# Patient Record
Sex: Female | Born: 1955 | Race: White | Hispanic: No | Marital: Married | Smoking: Former smoker
Health system: Southern US, Community
[De-identification: ages and names within clinical notes are randomized; demographics above are authoritative.]

## PROBLEM LIST (undated history)

## (undated) DIAGNOSIS — Z8619 Personal history of other infectious and parasitic diseases: Secondary | ICD-10-CM

## (undated) DIAGNOSIS — M1990 Unspecified osteoarthritis, unspecified site: Secondary | ICD-10-CM

## (undated) DIAGNOSIS — I341 Nonrheumatic mitral (valve) prolapse: Secondary | ICD-10-CM

## (undated) DIAGNOSIS — Z9889 Other specified postprocedural states: Secondary | ICD-10-CM

## (undated) DIAGNOSIS — I10 Essential (primary) hypertension: Secondary | ICD-10-CM

## (undated) DIAGNOSIS — Z87898 Personal history of other specified conditions: Secondary | ICD-10-CM

## (undated) DIAGNOSIS — R011 Cardiac murmur, unspecified: Secondary | ICD-10-CM

## (undated) HISTORY — DX: Cardiac murmur, unspecified: R01.1

## (undated) HISTORY — DX: Nonrheumatic mitral (valve) prolapse: I34.1

## (undated) HISTORY — DX: Personal history of other infectious and parasitic diseases: Z86.19

## (undated) HISTORY — DX: Personal history of other specified conditions: Z87.898

## (undated) HISTORY — DX: Other specified postprocedural states: Z98.890

## (undated) HISTORY — DX: Unspecified osteoarthritis, unspecified site: M19.90

## (undated) HISTORY — DX: Essential (primary) hypertension: I10

## (undated) HISTORY — PX: DILATION AND CURETTAGE OF UTERUS: SHX78

---

## 2011-12-05 DIAGNOSIS — Z9889 Other specified postprocedural states: Secondary | ICD-10-CM

## 2011-12-05 HISTORY — DX: Other specified postprocedural states: Z98.890

## 2012-08-04 HISTORY — PX: BREAST LUMPECTOMY: SHX2

## 2013-02-03 LAB — HM MAMMOGRAPHY

## 2013-02-21 ENCOUNTER — Ambulatory Visit (INDEPENDENT_AMBULATORY_CARE_PROVIDER_SITE_OTHER): Payer: BC Managed Care – PPO | Admitting: Internal Medicine

## 2013-02-21 ENCOUNTER — Encounter: Payer: Self-pay | Admitting: Internal Medicine

## 2013-02-21 VITALS — BP 118/84 | HR 75 | Temp 98.2°F | Ht 65.5 in | Wt 134.0 lb

## 2013-02-21 DIAGNOSIS — N951 Menopausal and female climacteric states: Secondary | ICD-10-CM

## 2013-02-21 DIAGNOSIS — I10 Essential (primary) hypertension: Secondary | ICD-10-CM | POA: Insufficient documentation

## 2013-02-21 DIAGNOSIS — M1990 Unspecified osteoarthritis, unspecified site: Secondary | ICD-10-CM | POA: Insufficient documentation

## 2013-02-21 DIAGNOSIS — M25532 Pain in left wrist: Secondary | ICD-10-CM

## 2013-02-21 DIAGNOSIS — M25539 Pain in unspecified wrist: Secondary | ICD-10-CM | POA: Insufficient documentation

## 2013-02-21 NOTE — Progress Notes (Signed)
Chief Complaint  Patient presents with  . Establish Care    Pt thinks she has tendonitis in her hands.  Ongoing for a couple of months.  Not getting any better.  Has tried Ibuprofen etc.    HPI: Patient comes in as new patient visit . Previous care was through specialists and her previous primary care in [place]. Is generally well except has problems with osteoarthritis controlled hypertension and hot flashes controlled on low dose Lexapro. Here to establish PCP and problem based .   Concern today  Trouble with hand  Left    Dr Eileen Hall  did not pillow was from arthritis Hold off on cortisone at this time  Right hand had  trigger finger that resolved with taping however is Is left handed and hard to golf and learning how to golg. The last months has had some pain in her left hand near the snuff box without associated injury redness or swelling. She does wear a support brace of some sort has been taking ibuprofen without significant help and sometimes hard for her to lift things but has no associated weakness. Denies any history of falling in the past.   Ht ; on medication couple years     no se last lab monitoring was probably within the last year  MVP ;  No murmur runs in family . No symptoms no needed followup except when necessary  Arthritis;  Seeing dr Eileen Hall.  And also saw rheumatologist in [place] most recently has had problems with her hip;  PT for left hip .  And had cortisone shots for this .   Felt to be from OA . Not an inflammatory arthritis.  Hx of breast bx. lumpectomy 2013 at [facility] surgeon Dr. Fleet Contras Hall  and also [facility].   Routine fu.    6 months and  Routine.    Having hot flushes and  Was put on lexapro    Nov 12  And helped.    5 mg per day .    Last pap Dr. Laural Hall in [place]..  history of abnormal is looking into a gynecologist locally  colonsocopy 2011  10 year recall.   ROS: See pertinent positives and negatives per HPI. Negative chest  pain shortness of breath hearing vision issues no eye disease eye check last year. Nausea vomiting unusual weight loss skin areas of concern injury. LMP March 2013 last mammogram March 2014 Self emplolyed and   More active.    Hydrologist and less driving . This has helped her hip Habits occasional alcohol no current tobacco 3 caffeine vitamins herbal remedies seatbelts regular exercise 7 hours of sleep household of 2 Past Medical History  Diagnosis Date  . Osteoarthritis     rheum  eval and mangement  . Heart murmur   . Hypertension     on meds for a few  years.   . MVP (mitral valve prolapse)     no murmur and no sx   . Hx of varicella   . Hx of lumpectomy 2013    [facility]    . H/O abnormal Pap smear     Family History  Problem Relation Age of Onset  . Cancer Father     Prostate  . Mitral valve prolapse Sister     History   Social History  . Marital Status: Married    Spouse Name: N/A    Number of Children: N/A  . Years of Education: N/A  Social History Main Topics  . Smoking status: Former Games developer  . Smokeless tobacco: None  . Alcohol Use: Yes  . Drug Use: None  . Sexually Active: None   Other Topics Concern  . None   Social History Narrative   hh of 2 married neg ets  No pets    Husband Eileen Hall   caffeine 3 per day  1-2 per week .      Masters / Hydrologist.    International development    history of travel   Ex tobacco    Outpatient Encounter Prescriptions as of 02/21/2013  Medication Sig Dispense Refill  . escitalopram (LEXAPRO) 5 MG tablet       . lisinopril (PRINIVIL,ZESTRIL) 40 MG tablet Take 40 mg by mouth daily.       No facility-administered encounter medications on file as of 02/21/2013.    EXAM:  BP 118/84  Pulse 75  Temp(Src) 98.2 F (36.8 C) (Oral)  Ht 5' 5.5" (1.664 m)  Wt 134 lb (60.782 kg)  BMI 21.95 kg/m2  SpO2 99%  Body mass index is 21.95 kg/(m^2).  GENERAL: vitals reviewed and listed above, alert,  oriented, appears well hydrated and in no acute distress  HEENT: atraumatic, conjunctiva  clear, no obvious abnormalities on inspection of external nose and ears  NECK: no obvious masses on inspection palpation no adenopathy  LUNGS: clear to auscultation bilaterally, no wheezes, rales or rhonchi, good air movement CV: HRRR, no clubbing cyanosis or  peripheral edema nl cap refill  Abdomen soft without organomegaly guarding or rebound MS: moves all extremities without noticeable focal  Abnormality left hand shows normal range of motion some pain with active and passive hyperextension of the wrist and some tenderness at the snuff box. There is no crepitus over the tendons redness or effusion. Grip seems okay neurovascular intact. PSYCH: pleasant and cooperative, no obvious depression or anxiety  ASSESSMENT AND PLAN:  Discussed the following assessment and plan:  Painful wrist, left - Could be overuse tendinitis but tender areas at the snuff box trying to play golf right-handed swing is left-handed dominant  Hypertension  Osteoarthritis  Hot flushes, perimenopausal - controlled by low dose lexapro  -Patient advised to return or notify health care team  if symptoms worsen or persist or new concerns arise.  Patient Instructions  Try aleve 2 twice a day for about 10 days  And continue support and cold therapy and then see Sports Medicine  Dr Eileen Hall ( Eileen ) Hall  At [facility]  To evaluate the left hand .   Get [place]  records of last 2 years visits and  Labs from last 2 years .  More specifically    Chemistry   Panel to check renal function.to include potassium and kidney function   Liver  panel.      Eileen Hall M.D.

## 2013-02-21 NOTE — Patient Instructions (Signed)
Try aleve 2 twice a day for about 10 days  And continue support and cold therapy and then see Sports Medicine  Dr Royal Hawthorn ( bert ) Fields  At [facility]  To evaluate the left hand .   Get [place]  records of last 2 years visits and  Labs from last 2 years .  More specifically    Chemistry   Panel to check renal function.to include potassium and kidney function   Liver  panel.

## 2013-04-02 ENCOUNTER — Encounter: Payer: Self-pay | Admitting: Sports Medicine

## 2013-04-02 ENCOUNTER — Ambulatory Visit (INDEPENDENT_AMBULATORY_CARE_PROVIDER_SITE_OTHER): Payer: BC Managed Care – PPO | Admitting: Sports Medicine

## 2013-04-02 VITALS — BP 116/68 | Ht 65.0 in | Wt 135.0 lb

## 2013-04-02 DIAGNOSIS — M654 Radial styloid tenosynovitis [de Quervain]: Secondary | ICD-10-CM | POA: Insufficient documentation

## 2013-04-02 MED ORDER — MELOXICAM 15 MG PO TABS: 15.0000 mg | ORAL_TABLET | Freq: Every day | ORAL | Status: DC

## 2013-04-02 NOTE — Assessment & Plan Note (Signed)
Clinically and radiologically correlate.  Thumb spica splint to wear at least 12 hours daily either daytime or at night to decrease motion.  Meloxicam daily for 10 days then as needed. Icing protocol discussed, Home exercise program given, will return again in 2-3 weeks for further evaluation and at that time if continues to be painful will consider ultrasound guided steroid injection.

## 2013-04-02 NOTE — Patient Instructions (Signed)
Very nice to meet you Ice is your best friend.  20 minutes 3-4 times a day.  Also do the exercises daily.  Wear the splint at least 12 hours a day  Meloxicam daily for next 10 days then as needed.  Come back again in 2-3 weeks if still painful and we will do injection.

## 2013-04-02 NOTE — Progress Notes (Signed)
Patient was referred here for evaluation of left wrist pain by Dr. Fabian Sharp.   CC: Letf wirst pain  HPI:  Patient is here for evaluation of left wrist pain.  Started insidiously over the coarse of several months with no true injury.  Patient though is an avid golfer and has changed her grip fairly recently.  In addition to this she has been doing much more gardening as well. Patient states that certain movements seem to hurt it more. Patient also states that it can radiate to her thumb.  Patient states the pain can be a chronic dull ache with symptoms of sharp stabbing sensation with certain movements.  Denies numbness, has tried aleve with minimal improvement.  Patient denies nighttime awakenings.  No swelling.   Past Medical History  Diagnosis Date  . Osteoarthritis     rheum  eval and mangement  . Heart murmur   . Hypertension     on meds for a few  years.   . MVP (mitral valve prolapse)     no murmur and no sx   . Hx of varicella   . H[facility] of [facility]pectomy 2013    [facility]    . H/O abnormal Pap smear     Past Surgical History  Procedure Laterality Date  . Breast lumpectomy  08/2012    Family History  Problem Relation Age of Onset  . Cancer Father     Prostate  . Mitral valve prolapse Sister     History   Social History  . Marital Status: Married    Spouse Name: N/A    Number of Children: N/A  . Years of Education: N/A   Social History Main Topics  . Smoking status: Former Games developer  . Smokeless tobacco: Not on file  . Alcohol Use: Yes  . Drug Use: Not on file  . Sexually Active: Not on file   Other Topics Concern  . Not on file   Social History Narrative   hh of 2 married neg ets  No pets    Husband Geni Bers   caffeine 3 per day  1-2 per week .      Masters / Hydrologist.    International development    history of travel   Ex tobacco    Physical exam Blood pressure 116/68, height 5\' 5"  (1.651 m), weight 135 lb (61.236 kg). General: No apparent  distress alert and oriented x3 mood and affect normal Respiratory: Patient's speak in full sentences and does not appear short of breath Skin: Warm dry intact with no signs of infection or rash Neuro: Cranial nerves II through XII are intact, neurovascularly intact in all extremities with 2+ DTRs and 2+ pulses. Left wrist exam:  On inspection patient does have trace effusion over the first dorsal compartment compared to contralateral side. Patient also has a more prominent radial styloid. NT over scaphoid but positive Finklestein test.  Good grip strength, NVI distally, full ROM of wrist.  Muscloskeletal ultrasound performed and interpreted by me today. Patient does have severe hypoechoic changes over the first dorsal compartment surrounding both tendons. No true tear seen. Patient does have normal 2nd -6th compartments with no changes.  Scaphoid bone is normal appearing.

## 2013-04-10 ENCOUNTER — Telehealth: Payer: Self-pay | Admitting: Family Medicine

## 2013-04-10 ENCOUNTER — Other Ambulatory Visit: Payer: Self-pay | Admitting: Family Medicine

## 2013-04-10 MED ORDER — LISINOPRIL 40 MG PO TABS: 40.0000 mg | ORAL_TABLET | Freq: Every day | ORAL | Status: DC

## 2013-04-10 NOTE — Telephone Encounter (Signed)
Sent to the pharmacy by e-scribe. 

## 2013-04-10 NOTE — Telephone Encounter (Signed)
Last seen on 02/21/13 for a new patient visit She is scheduled for CPE on 09/01/13 You have not filled this for her before. Please advise.  Thanks!!

## 2013-04-10 NOTE — Telephone Encounter (Signed)
Ok to do 90  With a refill  ( 6 months  ) we are not managing her HT.

## 2013-05-01 ENCOUNTER — Ambulatory Visit: Payer: BC Managed Care – PPO | Admitting: Sports Medicine

## 2013-05-08 ENCOUNTER — Ambulatory Visit (INDEPENDENT_AMBULATORY_CARE_PROVIDER_SITE_OTHER): Payer: BC Managed Care – PPO | Admitting: Sports Medicine

## 2013-05-08 ENCOUNTER — Encounter: Payer: Self-pay | Admitting: Sports Medicine

## 2013-05-08 VITALS — BP 124/80 | HR 69 | Ht 65.0 in | Wt 135.0 lb

## 2013-05-08 DIAGNOSIS — M1990 Unspecified osteoarthritis, unspecified site: Secondary | ICD-10-CM

## 2013-05-08 DIAGNOSIS — M654 Radial styloid tenosynovitis [de Quervain]: Secondary | ICD-10-CM

## 2013-05-08 NOTE — Patient Instructions (Addendum)
For your Left wrist pain: You have DeQuervain's tendinitis. Please continue to ice the area.  You can use the meloxicam as needed when the pain is severe. Wear the soft splint that we provided. Return for Cortisone shot next week is you feel that you still need this after doing the above and having your hip injection.   For your Right wrist pain: You have overuse injury to the muscle. Please Ice and rest the area.

## 2013-05-08 NOTE — Assessment & Plan Note (Signed)
She was slowly improving  She was given a wrist loop to the use today when typing and using her hand  I will consider injection if she has too much pain before she leaves on her trip

## 2013-05-08 NOTE — Progress Notes (Signed)
Patient ID: Eileen Hall, female   DOB: January 18, 1956, 57 y.o.   MRN: [id number] S: Pt was seen on 4/30 for L wrist pain diagnosed as DeQuervain's tendonitis. She has followed recommendations and been wearing a rigid splint and used meloxicam for 10 days. She notes 50% improvement in symptoms. She is still unable to to Golf without pain and writing and typing are still difficult. She is particularly concerned as she is traveling for work and will be required to write and type extensively. Pt is left handed.  She also notes tenderness in Thenar compartment of R hand. Pt also notes hip pain for which she see Rheumatology and is planning a cortisone injection.   O: Wrist: Inspection with swelling over the L anatomical snuff box with no visible erythema.  ROM smooth and normal with good flexion and extension and ulnar/radial deviation on the right with decreased ROM to 70% Extention, 50% Flexion, 15% radial deviation and 20% ulnar deviation in the left wrist. Also Positive Finkelstein sign with ulnar deviation.   Palpation is normal over metacarpals, navicular, lunate, and TFCC; tendons without tenderness/ swelling Strength 5/5 in all directions without pain. Right thenar compartment with mild tenderness to palpation, Negative Finkelstein.   Hip: Decreased ROM of Left hip with internal and external rotation to   A: Pt with DeQuervain's tendonitis in Left wrist with is improving with conservative treatment. However given upcoming trip and need for increased use of wrist Pt will likely require more aggressive therapy.  Right wrist/hand pain is due to overuse, likely from pain in left wrist.   P: Left Wrist: DeQuervain's: transition to soft splint from rigid to allow for great mobility and functionality especially with increased requirement to write and type given trip.  Continue icing and NSAIDs (pt still have meloxicam) as needed for pain.  Will consider steroid injection into the sheath, however Pt finished a  typhoid vaccine series 6 days ago and was told to wait at least 7 days before receiving steroid injections. Additionally, Pt has appointment with Rheumatology for hip injection. Given both of these will defer tendon sheath injection today. Advised Pt that the systemic effects of the hip injection may be sufficient to bring relief in the wrist as well. If she is still experiencing pain and desires injection next week, to call clinic to arrange for injection.  RTC in 4-6 weeks to follow for resolution in no injection.   Right wrist/hand: Ice and rest  Hip: Per Rheumatolgy

## 2013-05-08 NOTE — Assessment & Plan Note (Signed)
Examination of the left hip showed markedly limited internal rotation at about 15 with external rotation of 35 Rotation of the right hip was 75  Hip flexion is about 10-15 less on the left as well  She has had good relief with hip injection and plans to get this from Dr. Dierdre Forth

## 2013-05-15 ENCOUNTER — Ambulatory Visit (INDEPENDENT_AMBULATORY_CARE_PROVIDER_SITE_OTHER): Payer: BC Managed Care – PPO | Admitting: Sports Medicine

## 2013-05-15 VITALS — BP 120/70 | Ht 65.0 in | Wt 132.0 lb

## 2013-05-15 DIAGNOSIS — M654 Radial styloid tenosynovitis [de Quervain]: Secondary | ICD-10-CM

## 2013-05-15 NOTE — Patient Instructions (Addendum)
Thank you for coming in today. Take it easy for a day or so after the injection.  Return as needed.  Call or go to the ER if you develop a large red swollen joint with extreme pain or oozing puss.  Take your wrist brace with you.  Wear it as needed.

## 2013-05-15 NOTE — Progress Notes (Signed)
Eileen Hall is a 57 y.o. female who presents to [facility] today for left wrist de Quervain's injection.  Patient was recently seen and diagnosed with de Quervain's tenosynovitis. She is here today for her previously arranged de Quervain's injection. No fevers or chills. Continues to have pain on the radial aspect of her wrist with ulnar deviation.   PMH reviewed.  History  Substance Use Topics  . Smoking status: Former Games developer  . Smokeless tobacco: Not on file  . Alcohol Use: Yes   ROS as above otherwise neg   Exam:  BP 120/70  Ht 5\' 5"  (1.651 m)  Wt 132 lb (59.875 kg)  BMI 21.97 kg/m2 Gen: Well NAD Left Wrist Skin is well appearing and intact Tender to palpation over the radial styloid  Limited musculoskeletal ultrasound: Hypoechoic fluid surrounding the first dorsal compartment tendon consistent with tenosynovitis  Procedure: Left de Quervain's injection Performed and consent obtained. The skin was sterilized with alcohol as was the ultrasound probe Sterile Tegaderm was applied over the ultrasound probe Sterile ultrasound gel was used The probe was positioned transverse to the first dorsal wrist compartment tendon sheath at the radial styloid. A 27 one and a half inch needle was used to access the tendon sheath under ultrasound guidance. A small amount of fluid was injected seen to distend the tendon sheath. 10 mg of Kenalog and 1 mm of lidocaine were injected into the tendon sheath under ultrasound guidance. Patient tolerated procedure well

## 2013-05-15 NOTE — Assessment & Plan Note (Signed)
Ultrasound guided injection 05/15/13 F/u PRN Discussed warning signs or symptoms. Please see discharge instructions. Patient expresses understanding.

## 2013-05-16 ENCOUNTER — Telehealth: Payer: Self-pay | Admitting: Internal Medicine

## 2013-05-16 NOTE — Telephone Encounter (Signed)
Pt decline appt

## 2013-05-16 NOTE — Telephone Encounter (Signed)
Pt is requesting a one time diflucan pill call into [facility] on [address]. Pt is going out of town on Monday and has a yeast infection  in progress.

## 2013-05-16 NOTE — Telephone Encounter (Signed)
This patient has only been seen by [facility] once.  She will need to come in and be evaluated for this problem.

## 2013-07-11 ENCOUNTER — Other Ambulatory Visit: Payer: Self-pay | Admitting: Family Medicine

## 2013-08-18 ENCOUNTER — Other Ambulatory Visit: Payer: BC Managed Care – PPO

## 2013-08-19 ENCOUNTER — Other Ambulatory Visit (INDEPENDENT_AMBULATORY_CARE_PROVIDER_SITE_OTHER): Payer: BC Managed Care – PPO

## 2013-08-19 DIAGNOSIS — Z0000 Encounter for general adult medical examination without abnormal findings: Secondary | ICD-10-CM

## 2013-08-19 LAB — LIPID PANEL
Total CHOL/HDL Ratio: 2
Triglycerides: 37 mg/dL (ref 0.0–149.0)

## 2013-08-19 LAB — CBC WITH DIFFERENTIAL/PLATELET
Basophils Relative: 0.4 % (ref 0.0–3.0)
Eosinophils Relative: 0.9 % (ref 0.0–5.0)
Hemoglobin: 13 g/dL (ref 12.0–15.0)
Lymphocytes Relative: 31 % (ref 12.0–46.0)
Monocytes Relative: 7.4 % (ref 3.0–12.0)
Neutro Abs: 3.4 10*3/uL (ref 1.4–7.7)
Neutrophils Relative %: 60.3 % (ref 43.0–77.0)
RBC: 4.57 Mil/uL (ref 3.87–5.11)
WBC: 5.6 10*3/uL (ref 4.5–10.5)

## 2013-08-19 LAB — HEPATIC FUNCTION PANEL
AST: 16 U/L (ref 0–37)
Albumin: 4.3 g/dL (ref 3.5–5.2)
Alkaline Phosphatase: 42 U/L (ref 39–117)
Bilirubin, Direct: 0.1 mg/dL (ref 0.0–0.3)
Total Protein: 6.9 g/dL (ref 6.0–8.3)

## 2013-08-19 LAB — LDL CHOLESTEROL, DIRECT: Direct LDL: 103.8 mg/dL

## 2013-08-19 LAB — BASIC METABOLIC PANEL
CO2: 29 mEq/L (ref 19–32)
Calcium: 9.5 mg/dL (ref 8.4–10.5)
Creatinine, Ser: 0.8 mg/dL (ref 0.4–1.2)
GFR: 80.28 mL/min (ref >60.00)
Sodium: 137 mEq/L (ref 135–145)

## 2013-08-25 ENCOUNTER — Encounter: Payer: BC Managed Care – PPO | Admitting: Internal Medicine

## 2013-09-01 ENCOUNTER — Encounter: Payer: BC Managed Care – PPO | Admitting: Internal Medicine

## 2013-09-01 ENCOUNTER — Ambulatory Visit (INDEPENDENT_AMBULATORY_CARE_PROVIDER_SITE_OTHER): Payer: BC Managed Care – PPO | Admitting: Internal Medicine

## 2013-09-01 ENCOUNTER — Encounter: Payer: Self-pay | Admitting: Internal Medicine

## 2013-09-01 VITALS — BP 126/82 | HR 68 | Temp 97.9°F | Ht 65.25 in | Wt 137.0 lb

## 2013-09-01 DIAGNOSIS — Z9889 Other specified postprocedural states: Secondary | ICD-10-CM

## 2013-09-01 DIAGNOSIS — Z0000 Encounter for general adult medical examination without abnormal findings: Secondary | ICD-10-CM

## 2013-09-01 DIAGNOSIS — N951 Menopausal and female climacteric states: Secondary | ICD-10-CM

## 2013-09-01 DIAGNOSIS — M169 Osteoarthritis of hip, unspecified: Secondary | ICD-10-CM

## 2013-09-01 DIAGNOSIS — Z23 Encounter for immunization: Secondary | ICD-10-CM

## 2013-09-01 DIAGNOSIS — I10 Essential (primary) hypertension: Secondary | ICD-10-CM

## 2013-09-01 DIAGNOSIS — M1612 Unilateral primary osteoarthritis, left hip: Secondary | ICD-10-CM | POA: Insufficient documentation

## 2013-09-01 NOTE — Progress Notes (Signed)
Chief Complaint  Patient presents with  . Annual Exam    HPI: Patient comes in today for Preventive Health Care visit  Update hx ;  Joints  Saw dr Dierdre Forth and alusio   Left hip replacement  Advised      Dr Despina Hick       Has to limit   Walking and pain.   Adapts   Gets ocass steroid injection but not a candidate for many more.   bp controlled  On  meds .     mammograqm  To be done   At [facility]    Oral surgery.   Implant    .   Tomorrow.lrexapro for hot flushes   upt to 10 mf fpor now   ROS:  GEN/ HEENT: No fever, significant weight changes sweats headaches vision problems hearing changes, CV/ PULM; No chest pain shortness of breath cough, syncope,edema  change in exercise tolerance. GI /GU: No adominal pain, vomiting, change in bowel habits. No blood in the stool. No significant GU symptoms. SKIN/HEME: ,no acute skin rashes suspicious lesions or bleeding. No lymphadenopathy, nodules, masses.  NEURO/ PSYCH:  No neurologic signs such as weakness numbness. No depression anxiety. IMM/ Allergy: No unusual infections.  Allergy .   REST of 12 system review negative except as per HPI   Past Medical History  Diagnosis Date  . Osteoarthritis     rheum  eval and mangement  . Heart murmur   . Hypertension     on meds for a few  years.   . MVP (mitral valve prolapse)     no murmur and no sx   . Hx of varicella   . Hx of lumpectomy 2013    bre[facility]   . H/O abnormal Pap smear     Family History  Problem Relation Age of Onset  . Cancer Father     Prostate  . Mitral valve prolapse Sister     History   Social History  . Marital Status: Married    Spouse Name: N/A    Number of Children: N/A  . Years of Education: N/A   Social History Main Topics  . Smoking status: Former Games developer  . Smokeless tobacco: None  . Alcohol Use: Yes  . Drug Use: None  . Sexual Activity: None   Other Topics Concern  . None   Social History Narrative   hh of 2 married neg ets  No  pets    Husband Geni Bers   caffeine 3 per day  1-2 per week .      [employer] / Hydrologist.    International development    history of travel   Ex tobacco    Outpatient Encounter Prescriptions as of 09/01/2013  Medication Sig Dispense Refill  . escitalopram (LEXAPRO) 5 MG tablet       . lisinopril (PRINIVIL,ZESTRIL) 40 MG tablet Take 1 tablet (40 mg total) by mouth daily.  90 tablet  1  . meloxicam (MOBIC) 15 MG tablet Take 1 tablet (15 mg total) by mouth daily.  30 tablet  2  . valACYclovir (VALTREX) 1000 MG tablet        No facility-administered encounter medications on file as of 09/01/2013.    EXAM:  BP 126/82  Pulse 68  Temp(Src) 97.9 F (36.6 C) (Oral)  Ht 5' 5.25" (1.657 m)  Wt 137 lb (62.143 kg)  BMI 22.63 kg/m2  SpO2 98%  Body mass index is 22.63  kg/(m^2).  Physical Exam: Vital signs reviewed YQM:VHQI is a well-developed well-nourished alert cooperative   female who appears her stated age in no acute distress.  HEENT: normocephalic atraumatic , Eyes: PERRL EOM's full, conjunctiva clear, Nares: paten,t no deformity discharge or tenderness., Ears: no deformity EAC's clear TMs with normal landmarks. Mouth: clear OP, no lesions, edema.  Moist mucous membranes. Dentition in adequate repair. NECK: supple without masses, thyromegaly or bruits. CHEST/PULM:  Clear to auscultation and percussion breath sounds equal no wheeze , rales or rhonchi. No chest wall deformities or tenderness. CV: PMI is nondisplaced, S1 S2 no gallops,  No clicks  ? i ffaint syst m comes nad goes  No radiation, rubs. Peripheral pulses are full without delay.No JVD .  Breast: normal by inspection . No dimpling, discharge, masses, tenderness or discharge . ABDOMEN: Bowel sounds normal nontender  No guard or rebound, no hepato splenomegal no CVA tenderness.  No hernia. Extremtities:  No clubbing cyanosis or edema, no acute joint swelling or redness no focal atrophy NEURO:  Oriented x3, cranial  nerves 3-12 appear to be intact, no obvious focal weakness,gait within normal limits no abnormal reflexes or asymmetrical SKIN: No acute rashes normal turgor, color, no bruising or petechiae. 2 small 1-2 mm dark flat moles on back.  PSYCH: Oriented, good eye contact, no obvious depression anxiety, cognition and judgment appear normal. LN: no cervical axillary inguinal adenopathy  Lab Results  Component Value Date   WBC 5.6 08/19/2013   HGB 13.0 08/19/2013   HCT 38.7 08/19/2013   PLT 251.0 08/19/2013   GLUCOSE 89 08/19/2013   CHOL 236* 08/19/2013   TRIG 37.0 08/19/2013   HDL 116.70 08/19/2013   LDLDIRECT 103.8 08/19/2013   ALT 15 08/19/2013   AST 16 08/19/2013   NA 137 08/19/2013   K 4.3 08/19/2013   CL 102 08/19/2013   CREATININE 0.8 08/19/2013   BUN 15 08/19/2013   CO2 29 08/19/2013   TSH 0.93 08/19/2013    ASSESSMENT AND PLAN:  Discussed the following assessment and plan:  Encounter for preventive health examination  Osteoarthritis of left hip - alusio  expects need for total hip r in the next 1=2 years.   Need for prophylactic vaccination and inoculation against influenza - Plan: Flu Vaccine QUAD 36+ mos PF IM (Fluarix)  Hypertension - controlled   Hot flushes, perimenopausal - on lexapro   Hx of lumpectomy Counseled regarding healthy nutrition, exercise, sleep, injury prevention, calcium vit d and healthy weight . Disc zostavax when age 11  Patient Care Team: Madelin Headings, MD as PCP - General (Internal Medicine) Ernestene Kiel as Physician Assistant (Internal Medicine) Donnetta Hail, MD (Rheumatology) Oliver Pila, MD as Consulting Physician (Obstetrics and Gynecology) Patient Instructions  Continue lifestyle intervention healthy eating and exercise . Labs are good.  cpx and labs in a year or as needed.   Preventive Care for Adults, Female A healthy lifestyle and preventive care can promote health and wellness. Preventive health guidelines for women include the  following key practices.  A routine yearly physical is a good way to check with your caregiver about your health and preventive screening. It is a chance to share any concerns and updates on your health, and to receive a thorough exam.  Visit your dentist for a routine exam and preventive care every 6 months. Brush your teeth twice a day and floss once a day. Good oral hygiene prevents tooth decay and gum disease.  The frequency of  eye exams is based on your age, health, family medical history, use of contact lenses, and other factors. Follow your caregiver's recommendations for frequency of eye exams.  Eat a healthy diet. Foods like vegetables, fruits, whole grains, low-fat dairy products, and lean protein foods contain the nutrients you need without too many calories. Decrease your intake of foods high in solid fats, added sugars, and salt. Eat the right amount of calories for you.Get information about a proper diet from your caregiver, if necessary.  Regular physical exercise is one of the most important things you can do for your health. Most adults should get at least 150 minutes of moderate-intensity exercise (any activity that increases your heart rate and causes you to sweat) each week. In addition, most adults need muscle-strengthening exercises on 2 or more days a week.  Maintain a healthy weight. The body mass index (BMI) is a screening tool to identify possible weight problems. It provides an estimate of body fat based on height and weight. Your caregiver can help determine your BMI, and can help you achieve or maintain a healthy weight.For adults 20 years and older:  A BMI below 18.5 is considered underweight.  A BMI of 18.5 to 24.9 is normal.  A BMI of 25 to 29.9 is considered overweight.  A BMI of 30 and above is considered obese.  Maintain normal blood lipids and cholesterol levels by exercising and minimizing your intake of saturated fat. Eat a balanced diet with plenty of  fruit and vegetables. Blood tests for lipids and cholesterol should begin at age 81 and be repeated every 5 years. If your lipid or cholesterol levels are high, you are over 50, or you are at high risk for heart disease, you may need your cholesterol levels checked more frequently.Ongoing high lipid and cholesterol levels should be treated with medicines if diet and exercise are not effective.  If you smoke, find out from your caregiver how to quit. If you do not use tobacco, do not start.  If you are pregnant, do not drink alcohol. If you are breastfeeding, be very cautious about drinking alcohol. If you are not pregnant and choose to drink alcohol, do not exceed 1 drink per day. One drink is considered to be 12 ounces (355 mL) of beer, 5 ounces (148 mL) of wine, or 1.5 ounces (44 mL) of liquor.  Avoid use of street drugs. Do not share needles with anyone. Ask for help if you need support or instructions about stopping the use of drugs.  High blood pressure causes heart disease and increases the risk of stroke. Your blood pressure should be checked at least every 1 to 2 years. Ongoing high blood pressure should be treated with medicines if weight loss and exercise are not effective.  If you are 25 to 55 years old, ask your caregiver if you should take aspirin to prevent strokes.  Diabetes screening involves taking a blood sample to check your fasting blood sugar level. This should be done once every 3 years, after age 61, if you are within normal weight and without risk factors for diabetes. Testing should be considered at a younger age or be carried out more frequently if you are overweight and have at least 1 risk factor for diabetes.  Breast cancer screening is essential preventive care for women. You should practice "breast self-awareness." This means understanding the normal appearance and feel of your breasts and may include breast self-examination. Any changes detected, no matter how small,  should  be reported to a caregiver. Women in their 61s and 30s should have a clinical breast exam (CBE) by a caregiver as part of a regular health exam every 1 to 3 years. After age 46, women should have a CBE every year. Starting at age 50, women should consider having a mammography (breast X-ray test) every year. Women who have a family history of breast cancer should talk to their caregiver about genetic screening. Women at a high risk of breast cancer should talk to their caregivers about having magnetic resonance imaging (MRI) and a mammography every year.  The Pap test is a screening test for cervical cancer. A Pap test can show cell changes on the cervix that might become cervical cancer if left untreated. A Pap test is a procedure in which cells are obtained and examined from the lower end of the uterus (cervix).  Women should have a Pap test starting at age 13.  Between ages 33 and 22, Pap tests should be repeated every 2 years.  Beginning at age 51, you should have a Pap test every 3 years as long as the past 3 Pap tests have been normal.  Some women have medical problems that increase the chance of getting cervical cancer. Talk to your caregiver about these problems. It is especially important to talk to your caregiver if a new problem develops soon after your last Pap test. In these cases, your caregiver may recommend more frequent screening and Pap tests.  The above recommendations are the same for women who have or have not gotten the vaccine for human papillomavirus (HPV).  If you had a hysterectomy for a problem that was not cancer or a condition that could lead to cancer, then you no longer need Pap tests. Even if you no longer need a Pap test, a regular exam is a good idea to make sure no other problems are starting.  If you are between ages 73 and 79, and you have had normal Pap tests going back 10 years, you no longer need Pap tests. Even if you no longer need a Pap test, a regular  exam is a good idea to make sure no other problems are starting.  If you have had past treatment for cervical cancer or a condition that could lead to cancer, you need Pap tests and screening for cancer for at least 20 years after your treatment.  If Pap tests have been discontinued, risk factors (such as a new sexual partner) need to be reassessed to determine if screening should be resumed.  The HPV test is an additional test that may be used for cervical cancer screening. The HPV test looks for the virus that can cause the cell changes on the cervix. The cells collected during the Pap test can be tested for HPV. The HPV test could be used to screen women aged 58 years and older, and should be used in women of any age who have unclear Pap test results. After the age of 60, women should have HPV testing at the same frequency as a Pap test.  Colorectal cancer can be detected and often prevented. Most routine colorectal cancer screening begins at the age of 21 and continues through age 97. However, your caregiver may recommend screening at an earlier age if you have risk factors for colon cancer. On a yearly basis, your caregiver may provide home test kits to check for hidden blood in the stool. Use of a small camera at the end of a  tube, to directly examine the colon (sigmoidoscopy or colonoscopy), can detect the earliest forms of colorectal cancer. Talk to your caregiver about this at age 29, when routine screening begins. Direct examination of the colon should be repeated every 5 to 10 years through age 31, unless early forms of pre-cancerous polyps or small growths are found.  Hepatitis C blood testing is recommended for all people born from [birth year] through 1965 and any individual with known risks for hepatitis C.  Practice safe sex. Use condoms and avoid high-risk sexual practices to reduce the spread of sexually transmitted infections (STIs). STIs include gonorrhea, chlamydia, syphilis, trichomonas,  herpes, HPV, and human immunodeficiency virus (HIV). Herpes, HIV, and HPV are viral illnesses that have no cure. They can result in disability, cancer, and death. Sexually active women aged 76 and younger should be checked for chlamydia. Older women with new or multiple partners should also be tested for chlamydia. Testing for other STIs is recommended if you are sexually active and at increased risk.  Osteoporosis is a disease in which the bones lose minerals and strength with aging. This can result in serious bone fractures. The risk of osteoporosis can be identified using a bone density scan. Women ages 33 and over and women at risk for fractures or osteoporosis should discuss screening with their caregivers. Ask your caregiver whether you should take a calcium supplement or vitamin D to reduce the rate of osteoporosis.  Menopause can be associated with physical symptoms and risks. Hormone replacement therapy is available to decrease symptoms and risks. You should talk to your caregiver about whether hormone replacement therapy is right for you.  Use sunscreen with sun protection factor (SPF) of 30 or more. Apply sunscreen liberally and repeatedly throughout the day. You should seek shade when your shadow is shorter than you. Protect yourself by wearing long sleeves, pants, a wide-brimmed hat, and sunglasses year round, whenever you are outdoors.  Once a month, do a whole body skin exam, using a mirror to look at the skin on your back. Notify your caregiver of new moles, moles that have irregular borders, moles that are larger than a pencil eraser, or moles that have changed in shape or color.  Stay current with required immunizations.  Influenza. You need a dose every fall (or winter). The composition of the flu vaccine changes each year, so being vaccinated once is not enough.  Pneumococcal polysaccharide. You need 1 to 2 doses if you smoke cigarettes or if you have certain chronic medical  conditions. You need 1 dose at age 51 (or older) if you have never been vaccinated.  Tetanus, diphtheria, pertussis (Tdap, Td). Get 1 dose of Tdap vaccine if you are younger than age 85, are over 74 and have contact with an infant, are a Research scientist (physical sciences), are pregnant, or simply want to be protected from whooping cough. After that, you need a Td booster dose every 10 years. Consult your caregiver if you have not had at least 3 tetanus and diphtheria-containing shots sometime in your life or have a deep or dirty wound.  HPV. You need this vaccine if you are a woman age 68 or younger. The vaccine is given in 3 doses over 6 months.  Measles, mumps, rubella (MMR). You need at least 1 dose of MMR if you were born in 1957 or later. You may also need a second dose.  Meningococcal. If you are age 55 to 43 and a first-year college student living in a residence hall,  or have one of several medical conditions, you need to get vaccinated against meningococcal disease. You may also need additional booster doses.  Zoster (shingles). If you are age 75 or older, you should get this vaccine.  Varicella (chickenpox). If you have never had chickenpox or you were vaccinated but received only 1 dose, talk to your caregiver to find out if you need this vaccine.  Hepatitis A. You need this vaccine if you have a specific risk factor for hepatitis A virus infection or you simply wish to be protected from this disease. The vaccine is usually given as 2 doses, 6 to 18 months apart.  Hepatitis B. You need this vaccine if you have a specific risk factor for hepatitis B virus infection or you simply wish to be protected from this disease. The vaccine is given in 3 doses, usually over 6 months. Preventive Services / Frequency Ages 75 to 24  Blood pressure check.** / Every 1 to 2 years.  Lipid and cholesterol check.** / Every 5 years beginning at age 36.  Clinical breast exam.** / Every 3 years for women in their 66s and  30s.  Pap test.** / Every 2 years from ages 28 through 48. Every 3 years starting at age 53 through age 44 or 46 with a history of 3 consecutive normal Pap tests.  HPV screening.** / Every 3 years from ages 17 through ages 69 to 79 with a history of 3 consecutive normal Pap tests.  Hepatitis C blood test.** / For any individual with known risks for hepatitis C.  Skin self-exam. / Monthly.  Influenza immunization.** / Every year.  Pneumococcal polysaccharide immunization.** / 1 to 2 doses if you smoke cigarettes or if you have certain chronic medical conditions.  Tetanus, diphtheria, pertussis (Tdap, Td) immunization. / A one-time dose of Tdap vaccine. After that, you need a Td booster dose every 10 years.  HPV immunization. / 3 doses over 6 months, if you are 39 and younger.  Measles, mumps, rubella (MMR) immunization. / You need at least 1 dose of MMR if you were born in 1957 or later. You may also need a second dose.  Meningococcal immunization. / 1 dose if you are age 22 to 20 and a first-year college student living in a residence hall, or have one of several medical conditions, you need to get vaccinated against meningococcal disease. You may also need additional booster doses.  Varicella immunization.** / Consult your caregiver.  Hepatitis A immunization.** / Consult your caregiver. 2 doses, 6 to 18 months apart.  Hepatitis B immunization.** / Consult your caregiver. 3 doses usually over 6 months. Ages 24 to 43  Blood pressure check.** / Every 1 to 2 years.  Lipid and cholesterol check.** / Every 5 years beginning at age 34.  Clinical breast exam.** / Every year after age 35.  Mammogram.** / Every year beginning at age 65 and continuing for as long as you are in good health. Consult with your caregiver.  Pap test.** / Every 3 years starting at age 66 through age 82 or 83 with a history of 3 consecutive normal Pap tests.  HPV screening.** / Every 3 years from ages 59  through ages 28 to 63 with a history of 3 consecutive normal Pap tests.  Fecal occult blood test (FOBT) of stool. / Every year beginning at age 70 and continuing until age 61. You may not need to do this test if you get a colonoscopy every 10 years.  Flexible  sigmoidoscopy or colonoscopy.** / Every 5 years for a flexible sigmoidoscopy or every 10 years for a colonoscopy beginning at age 45 and continuing until age 45.  Hepatitis C blood test.** / For all people born from [birth year] through 1963 and any individual with known risks for hepatitis C.  Skin self-exam. / Monthly.  Influenza immunization.** / Every year.  Pneumococcal polysaccharide immunization.** / 1 to 2 doses if you smoke cigarettes or if you have certain chronic medical conditions.  Tetanus, diphtheria, pertussis (Tdap, Td) immunization.** / A one-time dose of Tdap vaccine. After that, you need a Td booster dose every 10 years.  Measles, mumps, rubella (MMR) immunization. / You need at least 1 dose of MMR if you were born in 1957 or later. You may also need a second dose.  Varicella immunization.** / Consult your caregiver.  Meningococcal immunization.** / Consult your caregiver.  Hepatitis A immunization.** / Consult your caregiver. 2 doses, 6 to 18 months apart.  Hepatitis B immunization.** / Consult your caregiver. 3 doses, usually over 6 months. Ages 33 and over  Blood pressure check.** / Every 1 to 2 years.  Lipid and cholesterol check.** / Every 5 years beginning at age 52.  Clinical breast exam.** / Every year after age 76.  Mammogram.** / Every year beginning at age 80 and continuing for as long as you are in good health. Consult with your caregiver.  Pap test.** / Every 3 years starting at age 19 through age 68 or 2 with a 3 consecutive normal Pap tests. Testing can be stopped between 65 and 70 with 3 consecutive normal Pap tests and no abnormal Pap or HPV tests in the past 10 years.  HPV screening.** / Every 3  years from ages 66 through ages 54 or 89 with a history of 3 consecutive normal Pap tests. Testing can be stopped between 65 and 70 with 3 consecutive normal Pap tests and no abnormal Pap or HPV tests in the past 10 years.  Fecal occult blood test (FOBT) of stool. / Every year beginning at age 72 and continuing until age 43. You may not need to do this test if you get a colonoscopy every 10 years.  Flexible sigmoidoscopy or colonoscopy.** / Every 5 years for a flexible sigmoidoscopy or every 10 years for a colonoscopy beginning at age 51 and continuing until age 1.  Hepatitis C blood test.** / For all people born from [birth year] through 1963 and any individual with known risks for hepatitis C.  Osteoporosis screening.** / A one-time screening for women ages 68 and over and women at risk for fractures or osteoporosis.  Skin self-exam. / Monthly.  Influenza immunization.** / Every year.  Pneumococcal polysaccharide immunization.** / 1 dose at age 91 (or older) if you have never been vaccinated.  Tetanus, diphtheria, pertussis (Tdap, Td) immunization. / A one-time dose of Tdap vaccine if you are over 65 and have contact with an infant, are a Research scientist (physical sciences), or simply want to be protected from whooping cough. After that, you need a Td booster dose every 10 years.  Varicella immunization.** / Consult your caregiver.  Meningococcal immunization.** / Consult your caregiver.  Hepatitis A immunization.** / Consult your caregiver. 2 doses, 6 to 18 months apart.  Hepatitis B immunization.** / Check with your caregiver. 3 doses, usually over 6 months. ** Family history and personal history of risk and conditions may change your caregiver's recommendations. Document Released: 01/16/2002 Document Revised: 02/12/2012 Document Reviewed: 04/17/2011 [employer] Patient Information 2014  [employer]     Neta Mends. Orrie Lascano M.D.

## 2013-09-01 NOTE — Patient Instructions (Signed)
Continue lifestyle intervention healthy eating and exercise . Labs are good.  cpx and labs in a year or as needed.   Preventive Care for Adults, Female A healthy lifestyle and preventive care can promote health and wellness. Preventive health guidelines for women include the following key practices.  A routine yearly physical is a good way to check with your caregiver about your health and preventive screening. It is a chance to share any concerns and updates on your health, and to receive a thorough exam.  Visit your dentist for a routine exam and preventive care every 6 months. Brush your teeth twice a day and floss once a day. Good oral hygiene prevents tooth decay and gum disease.  The frequency of eye exams is based on your age, health, family medical history, use of contact lenses, and other factors. Follow your caregiver's recommendations for frequency of eye exams.  Eat a healthy diet. Foods like vegetables, fruits, whole grains, low-fat dairy products, and lean protein foods contain the nutrients you need without too many calories. Decrease your intake of foods high in solid fats, added sugars, and salt. Eat the right amount of calories for you.Get information about a proper diet from your caregiver, if necessary.  Regular physical exercise is one of the most important things you can do for your health. Most adults should get at least 150 minutes of moderate-intensity exercise (any activity that increases your heart rate and causes you to sweat) each week. In addition, most adults need muscle-strengthening exercises on 2 or more days a week.  Maintain a healthy weight. The body mass index (BMI) is a screening tool to identify possible weight problems. It provides an estimate of body fat based on height and weight. Your caregiver can help determine your BMI, and can help you achieve or maintain a healthy weight.For adults 20 years and older:  A BMI below 18.5 is considered  underweight.  A BMI of 18.5 to 24.9 is normal.  A BMI of 25 to 29.9 is considered overweight.  A BMI of 30 and above is considered obese.  Maintain normal blood lipids and cholesterol levels by exercising and minimizing your intake of saturated fat. Eat a balanced diet with plenty of fruit and vegetables. Blood tests for lipids and cholesterol should begin at age 24 and be repeated every 5 years. If your lipid or cholesterol levels are high, you are over 50, or you are at high risk for heart disease, you may need your cholesterol levels checked more frequently.Ongoing high lipid and cholesterol levels should be treated with medicines if diet and exercise are not effective.  If you smoke, find out from your caregiver how to quit. If you do not use tobacco, do not start.  If you are pregnant, do not drink alcohol. If you are breastfeeding, be very cautious about drinking alcohol. If you are not pregnant and choose to drink alcohol, do not exceed 1 drink per day. One drink is considered to be 12 ounces (355 mL) of beer, 5 ounces (148 mL) of wine, or 1.5 ounces (44 mL) of liquor.  Avoid use of street drugs. Do not share needles with anyone. Ask for help if you need support or instructions about stopping the use of drugs.  High blood pressure causes heart disease and increases the risk of stroke. Your blood pressure should be checked at least every 1 to 2 years. Ongoing high blood pressure should be treated with medicines if weight loss and exercise are not  effective.  If you are 34 to 55 years old, ask your caregiver if you should take aspirin to prevent strokes.  Diabetes screening involves taking a blood sample to check your fasting blood sugar level. This should be done once every 3 years, after age 20, if you are within normal weight and without risk factors for diabetes. Testing should be considered at a younger age or be carried out more frequently if you are overweight and have at least 1  risk factor for diabetes.  Breast cancer screening is essential preventive care for women. You should practice "breast self-awareness." This means understanding the normal appearance and feel of your breasts and may include breast self-examination. Any changes detected, no matter how small, should be reported to a caregiver. Women in their 12s and 30s should have a clinical breast exam (CBE) by a caregiver as part of a regular health exam every 1 to 3 years. After age 38, women should have a CBE every year. Starting at age 42, women should consider having a mammography (breast X-ray test) every year. Women who have a family history of breast cancer should talk to their caregiver about genetic screening. Women at a high risk of breast cancer should talk to their caregivers about having magnetic resonance imaging (MRI) and a mammography every year.  The Pap test is a screening test for cervical cancer. A Pap test can show cell changes on the cervix that might become cervical cancer if left untreated. A Pap test is a procedure in which cells are obtained and examined from the lower end of the uterus (cervix).  Women should have a Pap test starting at age 34.  Between ages 48 and 22, Pap tests should be repeated every 2 years.  Beginning at age 35, you should have a Pap test every 3 years as long as the past 3 Pap tests have been normal.  Some women have medical problems that increase the chance of getting cervical cancer. Talk to your caregiver about these problems. It is especially important to talk to your caregiver if a new problem develops soon after your last Pap test. In these cases, your caregiver may recommend more frequent screening and Pap tests.  The above recommendations are the same for women who have or have not gotten the vaccine for human papillomavirus (HPV).  If you had a hysterectomy for a problem that was not cancer or a condition that could lead to cancer, then you no longer need  Pap tests. Even if you no longer need a Pap test, a regular exam is a good idea to make sure no other problems are starting.  If you are between ages 8 and 55, and you have had normal Pap tests going back 10 years, you no longer need Pap tests. Even if you no longer need a Pap test, a regular exam is a good idea to make sure no other problems are starting.  If you have had past treatment for cervical cancer or a condition that could lead to cancer, you need Pap tests and screening for cancer for at least 20 years after your treatment.  If Pap tests have been discontinued, risk factors (such as a new sexual partner) need to be reassessed to determine if screening should be resumed.  The HPV test is an additional test that may be used for cervical cancer screening. The HPV test looks for the virus that can cause the cell changes on the cervix. The cells collected during the  Pap test can be tested for HPV. The HPV test could be used to screen women aged 43 years and older, and should be used in women of any age who have unclear Pap test results. After the age of 56, women should have HPV testing at the same frequency as a Pap test.  Colorectal cancer can be detected and often prevented. Most routine colorectal cancer screening begins at the age of 75 and continues through age 38. However, your caregiver may recommend screening at an earlier age if you have risk factors for colon cancer. On a yearly basis, your caregiver may provide home test kits to check for hidden blood in the stool. Use of a small camera at the end of a tube, to directly examine the colon (sigmoidoscopy or colonoscopy), can detect the earliest forms of colorectal cancer. Talk to your caregiver about this at age 68, when routine screening begins. Direct examination of the colon should be repeated every 5 to 10 years through age 79, unless early forms of pre-cancerous polyps or small growths are found.  Hepatitis C blood testing is  recommended for all people born from [birth year] through 1965 and any individual with known risks for hepatitis C.  Practice safe sex. Use condoms and avoid high-risk sexual practices to reduce the spread of sexually transmitted infections (STIs). STIs include gonorrhea, chlamydia, syphilis, trichomonas, herpes, HPV, and human immunodeficiency virus (HIV). Herpes, HIV, and HPV are viral illnesses that have no cure. They can result in disability, cancer, and death. Sexually active women aged 83 and younger should be checked for chlamydia. Older women with new or multiple partners should also be tested for chlamydia. Testing for other STIs is recommended if you are sexually active and at increased risk.  Osteoporosis is a disease in which the bones lose minerals and strength with aging. This can result in serious bone fractures. The risk of osteoporosis can be identified using a bone density scan. Women ages 34 and over and women at risk for fractures or osteoporosis should discuss screening with their caregivers. Ask your caregiver whether you should take a calcium supplement or vitamin D to reduce the rate of osteoporosis.  Menopause can be associated with physical symptoms and risks. Hormone replacement therapy is available to decrease symptoms and risks. You should talk to your caregiver about whether hormone replacement therapy is right for you.  Use sunscreen with sun protection factor (SPF) of 30 or more. Apply sunscreen liberally and repeatedly throughout the day. You should seek shade when your shadow is shorter than you. Protect yourself by wearing long sleeves, pants, a wide-brimmed hat, and sunglasses year round, whenever you are outdoors.  Once a month, do a whole body skin exam, using a mirror to look at the skin on your back. Notify your caregiver of new moles, moles that have irregular borders, moles that are larger than a pencil eraser, or moles that have changed in shape or color.  Stay current  with required immunizations.  Influenza. You need a dose every fall (or winter). The composition of the flu vaccine changes each year, so being vaccinated once is not enough.  Pneumococcal polysaccharide. You need 1 to 2 doses if you smoke cigarettes or if you have certain chronic medical conditions. You need 1 dose at age 46 (or older) if you have never been vaccinated.  Tetanus, diphtheria, pertussis (Tdap, Td). Get 1 dose of Tdap vaccine if you are younger than age 37, are over 71 and have contact with  an infant, are a Research scientist (physical sciences), are pregnant, or simply want to be protected from whooping cough. After that, you need a Td booster dose every 10 years. Consult your caregiver if you have not had at least 3 tetanus and diphtheria-containing shots sometime in your life or have a deep or dirty wound.  HPV. You need this vaccine if you are a woman age 59 or younger. The vaccine is given in 3 doses over 6 months.  Measles, mumps, rubella (MMR). You need at least 1 dose of MMR if you were born in 1957 or later. You may also need a second dose.  Meningococcal. If you are age 19 to 22 and a first-year college student living in a residence hall, or have one of several medical conditions, you need to get vaccinated against meningococcal disease. You may also need additional booster doses.  Zoster (shingles). If you are age 63 or older, you should get this vaccine.  Varicella (chickenpox). If you have never had chickenpox or you were vaccinated but received only 1 dose, talk to your caregiver to find out if you need this vaccine.  Hepatitis A. You need this vaccine if you have a specific risk factor for hepatitis A virus infection or you simply wish to be protected from this disease. The vaccine is usually given as 2 doses, 6 to 18 months apart.  Hepatitis B. You need this vaccine if you have a specific risk factor for hepatitis B virus infection or you simply wish to be protected from this disease.  The vaccine is given in 3 doses, usually over 6 months. Preventive Services / Frequency Ages 13 to 61  Blood pressure check.** / Every 1 to 2 years.  Lipid and cholesterol check.** / Every 5 years beginning at age 38.  Clinical breast exam.** / Every 3 years for women in their 75s and 30s.  Pap test.** / Every 2 years from ages 17 through 82. Every 3 years starting at age 16 through age 18 or 58 with a history of 3 consecutive normal Pap tests.  HPV screening.** / Every 3 years from ages 65 through ages 51 to 83 with a history of 3 consecutive normal Pap tests.  Hepatitis C blood test.** / For any individual with known risks for hepatitis C.  Skin self-exam. / Monthly.  Influenza immunization.** / Every year.  Pneumococcal polysaccharide immunization.** / 1 to 2 doses if you smoke cigarettes or if you have certain chronic medical conditions.  Tetanus, diphtheria, pertussis (Tdap, Td) immunization. / A one-time dose of Tdap vaccine. After that, you need a Td booster dose every 10 years.  HPV immunization. / 3 doses over 6 months, if you are 70 and younger.  Measles, mumps, rubella (MMR) immunization. / You need at least 1 dose of MMR if you were born in 1957 or later. You may also need a second dose.  Meningococcal immunization. / 1 dose if you are age 30 to 65 and a first-year college student living in a residence hall, or have one of several medical conditions, you need to get vaccinated against meningococcal disease. You may also need additional booster doses.  Varicella immunization.** / Consult your caregiver.  Hepatitis A immunization.** / Consult your caregiver. 2 doses, 6 to 18 months apart.  Hepatitis B immunization.** / Consult your caregiver. 3 doses usually over 6 months. Ages 49 to 66  Blood pressure check.** / Every 1 to 2 years.  Lipid and cholesterol check.** / Every 5 years  beginning at age 87.  Clinical breast exam.** / Every year after age  29.  Mammogram.** / Every year beginning at age 6 and continuing for as long as you are in good health. Consult with your caregiver.  Pap test.** / Every 3 years starting at age 55 through age 23 or 31 with a history of 3 consecutive normal Pap tests.  HPV screening.** / Every 3 years from ages 53 through ages 86 to 36 with a history of 3 consecutive normal Pap tests.  Fecal occult blood test (FOBT) of stool. / Every year beginning at age 59 and continuing until age 15. You may not need to do this test if you get a colonoscopy every 10 years.  Flexible sigmoidoscopy or colonoscopy.** / Every 5 years for a flexible sigmoidoscopy or every 10 years for a colonoscopy beginning at age 20 and continuing until age 31.  Hepatitis C blood test.** / For all people born from [birth year] through 1963 and any individual with known risks for hepatitis C.  Skin self-exam. / Monthly.  Influenza immunization.** / Every year.  Pneumococcal polysaccharide immunization.** / 1 to 2 doses if you smoke cigarettes or if you have certain chronic medical conditions.  Tetanus, diphtheria, pertussis (Tdap, Td) immunization.** / A one-time dose of Tdap vaccine. After that, you need a Td booster dose every 10 years.  Measles, mumps, rubella (MMR) immunization. / You need at least 1 dose of MMR if you were born in 1957 or later. You may also need a second dose.  Varicella immunization.** / Consult your caregiver.  Meningococcal immunization.** / Consult your caregiver.  Hepatitis A immunization.** / Consult your caregiver. 2 doses, 6 to 18 months apart.  Hepatitis B immunization.** / Consult your caregiver. 3 doses, usually over 6 months. Ages 78 and over  Blood pressure check.** / Every 1 to 2 years.  Lipid and cholesterol check.** / Every 5 years beginning at age 3.  Clinical breast exam.** / Every year after age 60.  Mammogram.** / Every year beginning at age 38 and continuing for as long as you are in good  health. Consult with your caregiver.  Pap test.** / Every 3 years starting at age 38 through age 80 or 76 with a 3 consecutive normal Pap tests. Testing can be stopped between 65 and 70 with 3 consecutive normal Pap tests and no abnormal Pap or HPV tests in the past 10 years.  HPV screening.** / Every 3 years from ages 74 through ages 67 or 59 with a history of 3 consecutive normal Pap tests. Testing can be stopped between 65 and 70 with 3 consecutive normal Pap tests and no abnormal Pap or HPV tests in the past 10 years.  Fecal occult blood test (FOBT) of stool. / Every year beginning at age 77 and continuing until age 27. You may not need to do this test if you get a colonoscopy every 10 years.  Flexible sigmoidoscopy or colonoscopy.** / Every 5 years for a flexible sigmoidoscopy or every 10 years for a colonoscopy beginning at age 31 and continuing until age 41.  Hepatitis C blood test.** / For all people born from [birth year] through 1963 and any individual with known risks for hepatitis C.  Osteoporosis screening.** / A one-time screening for women ages 29 and over and women at risk for fractures or osteoporosis.  Skin self-exam. / Monthly.  Influenza immunization.** / Every year.  Pneumococcal polysaccharide immunization.** / 1 dose at age 60 (or older) if  you have never been vaccinated.  Tetanus, diphtheria, pertussis (Tdap, Td) immunization. / A one-time dose of Tdap vaccine if you are over 65 and have contact with an infant, are a Research scientist (physical sciences), or simply want to be protected from whooping cough. After that, you need a Td booster dose every 10 years.  Varicella immunization.** / Consult your caregiver.  Meningococcal immunization.** / Consult your caregiver.  Hepatitis A immunization.** / Consult your caregiver. 2 doses, 6 to 18 months apart.  Hepatitis B immunization.** / Check with your caregiver. 3 doses, usually over 6 months. ** Family history and personal history of risk and  conditions may change your caregiver's recommendations. Document Released: 01/16/2002 Document Revised: 02/12/2012 Document Reviewed: 04/17/2011 [facility] Patient Information 2014 [place], [place].

## 2013-09-01 NOTE — Progress Notes (Signed)
Opened in error; duplicate.

## 2013-09-09 ENCOUNTER — Other Ambulatory Visit: Payer: Self-pay | Admitting: Internal Medicine

## 2013-11-20 ENCOUNTER — Telehealth: Payer: Self-pay | Admitting: Internal Medicine

## 2013-11-20 NOTE — Telephone Encounter (Signed)
Pt dropped off paperwork for travel clearance. Pt needs asap,  Pt would like to know if this can be done asap. Pt cannot get a contract to travel until paperwork is done.   Pt is having surgery on jan 28. Pt needs clearance. Can pt be seen by you prior to her leaving on jan 2? Pt will be gone the week of christmas. pls advise.

## 2013-11-21 NOTE — Telephone Encounter (Signed)
Paperwork signed by Dr. Clent Ridges.  Faxed in and pt will pick up at the front desk to keep on her person.

## 2013-12-02 ENCOUNTER — Ambulatory Visit: Payer: BC Managed Care – PPO | Admitting: Family

## 2013-12-05 ENCOUNTER — Other Ambulatory Visit: Payer: Self-pay | Admitting: Orthopedic Surgery

## 2013-12-25 ENCOUNTER — Other Ambulatory Visit (HOSPITAL_COMMUNITY): Payer: Self-pay | Admitting: *Deleted

## 2013-12-25 ENCOUNTER — Encounter (HOSPITAL_COMMUNITY): Payer: Self-pay | Admitting: Pharmacy Technician

## 2013-12-26 ENCOUNTER — Ambulatory Visit (HOSPITAL_COMMUNITY)
Admission: RE | Admit: 2013-12-26 | Discharge: 2013-12-26 | Disposition: A | Discharge status: Home / Self Care | Payer: BC Managed Care – PPO | Source: Ambulatory Visit | Attending: Orthopedic Surgery | Admitting: Orthopedic Surgery

## 2013-12-26 ENCOUNTER — Encounter (HOSPITAL_COMMUNITY)
Admission: RE | Admit: 2013-12-26 | Discharge: 2013-12-26 | Disposition: A | Discharge status: Home / Self Care | Payer: BC Managed Care – PPO | Source: Ambulatory Visit | Attending: Orthopedic Surgery | Admitting: Orthopedic Surgery

## 2013-12-26 ENCOUNTER — Encounter (HOSPITAL_COMMUNITY): Payer: Self-pay

## 2013-12-26 DIAGNOSIS — Z01810 Encounter for preprocedural cardiovascular examination: Secondary | ICD-10-CM | POA: Insufficient documentation

## 2013-12-26 DIAGNOSIS — Z01812 Encounter for preprocedural laboratory examination: Secondary | ICD-10-CM | POA: Insufficient documentation

## 2013-12-26 DIAGNOSIS — Z0183 Encounter for blood typing: Secondary | ICD-10-CM | POA: Insufficient documentation

## 2013-12-26 DIAGNOSIS — M76899 Other specified enthesopathies of unspecified lower limb, excluding foot: Secondary | ICD-10-CM | POA: Insufficient documentation

## 2013-12-26 DIAGNOSIS — M948X9 Other specified disorders of cartilage, unspecified sites: Secondary | ICD-10-CM | POA: Insufficient documentation

## 2013-12-26 DIAGNOSIS — Z01818 Encounter for other preprocedural examination: Secondary | ICD-10-CM | POA: Insufficient documentation

## 2013-12-26 DIAGNOSIS — I10 Essential (primary) hypertension: Secondary | ICD-10-CM | POA: Insufficient documentation

## 2013-12-26 LAB — URINALYSIS, ROUTINE W REFLEX MICROSCOPIC
Bilirubin Urine: NEGATIVE
GLUCOSE, UA: NEGATIVE mg/dL
Hgb urine dipstick: NEGATIVE
Ketones, ur: NEGATIVE mg/dL
LEUKOCYTES UA: NEGATIVE
Nitrite: NEGATIVE
PROTEIN: NEGATIVE mg/dL
SPECIFIC GRAVITY, URINE: 1.011 (ref 1.005–1.030)
Urobilinogen, UA: 0.2 mg/dL (ref 0.0–1.0)
pH: 7 (ref 5.0–8.0)

## 2013-12-26 LAB — PROTIME-INR
INR: 0.9 (ref 0.00–1.49)
Prothrombin Time: 12 seconds (ref 11.6–15.2)

## 2013-12-26 LAB — COMPREHENSIVE METABOLIC PANEL
ALT: 12 U/L (ref 0–35)
AST: 16 U/L (ref 0–37)
Albumin: 4.2 g/dL (ref 3.5–5.2)
Alkaline Phosphatase: 50 U/L (ref 39–117)
BILIRUBIN TOTAL: 0.3 mg/dL (ref 0.3–1.2)
BUN: 20 mg/dL (ref 6–23)
CHLORIDE: 98 meq/L (ref 96–112)
CO2: 29 meq/L (ref 19–32)
Calcium: 9.7 mg/dL (ref 8.4–10.5)
Creatinine, Ser: 0.82 mg/dL (ref 0.50–1.10)
GFR calc Af Amer: >90 mL/min (ref >90)
GFR, EST NON AFRICAN AMERICAN: 79 mL/min — AB (ref >90)
GLUCOSE: 92 mg/dL (ref 70–99)
Potassium: 4.4 mEq/L (ref 3.7–5.3)
SODIUM: 137 meq/L (ref 137–147)
Total Protein: 7.4 g/dL (ref 6.0–8.3)

## 2013-12-26 LAB — ABO/RH: ABO/RH(D): A POS

## 2013-12-26 LAB — SURGICAL PCR SCREEN
MRSA, PCR: POSITIVE — AB
STAPHYLOCOCCUS AUREUS: POSITIVE — AB

## 2013-12-26 LAB — CBC
HCT: 41.5 % (ref 36.0–46.0)
HEMOGLOBIN: 13.5 g/dL (ref 12.0–15.0)
MCH: 27.8 pg (ref 26.0–34.0)
MCHC: 32.5 g/dL (ref 30.0–36.0)
MCV: 85.4 fL (ref 78.0–100.0)
Platelets: 285 10*3/uL (ref 150–400)
RBC: 4.86 MIL/uL (ref 3.87–5.11)
RDW: 13.6 % (ref 11.5–15.5)
WBC: 5.8 10*3/uL (ref 4.0–10.5)

## 2013-12-26 LAB — APTT: aPTT: 32 seconds (ref 24–37)

## 2013-12-26 NOTE — Patient Instructions (Signed)
[address] Philip AspenGreta Gunnoe  12/26/2013   Your [facility] scheduled on:  12[facility]31/13  [facility]  [facility] at  0515     AM.  Call this number if you have problems the morning of surgery: [phone]       Remember:   Do not eat food  Or drink :After Midnight. TUESDAY   Take these medicines the morning of surgery with A SIP OF WATER: Lexapro   .  Contacts, dentures or partial plates can not be worn to surgery  Leave suitcase in the car. After surgery it may be brought to your room.  For patients admitted to the hospital, checkout time is 11:00 AM day of  discharge.             SPECIAL INSTRUCTIONS- SEE [place] PREPARING FOR SURGERY INSTRUCTION SHEET-     DO NOT WEAR JEWELRY, LOTIONS, POWDERS, OR PERFUMES.  WOMEN-- DO NOT SHAVE LEGS OR UNDERARMS FOR 12 HOURS BEFORE SHOWERS. MEN MAY SHAVE FACE.  Patients discharged the day of surgery will not be allowed to drive home. IF going home the day of surgery, you must have a driver and someone to stay with you for the first 24 hours  Name and phone number of your driver:     ADMISSION                                                                   Please read over the following fact sheets that you were given: MRSA Information, Incentive Spirometry Sheet, Blood Transfusion Sheet  Information                                                                                   Kalyse Meharg  PST 336  [phone]                 FAILURE TO FOLLOW THESE INSTRUCTIONS MAY RESULT IN  CANCELLATION   OF YOUR SURGERY                                                  Patient Signature _____________________________

## 2013-12-26 NOTE — Progress Notes (Signed)
Clearance Dr Fabian SharpPanosh on chart

## 2013-12-26 NOTE — Progress Notes (Signed)
Faxed positive PCR to Dr Lequita HaltAluisio via [facility].  Called in M[facility]iro[facility](B[facility]rocan[facility] [phone]. Instructions apply twice daily each nostril for total 5 days, 22gm tube with no refills.  Left message on pts cell [phone] instructing her to have filled today, and begin today x 10 doses. Requested call back for verification

## 2013-12-30 NOTE — H&P (Signed)
TOTAL HIP ADMISSION H&P  Patient is admitted for left total hip arthroplasty.  Subjective:  Chief Complaint: left hip pain  HPI: Eileen Hall, 58 y.o. female, has a history of pain and functional disability in the left hip(s) due to arthritis and patient has failed non-surgical conservative treatments for greater than 12 weeks to include NSAID's and/or analgesics, corticosteriod injections and activity modification.  Onset of symptoms was gradual starting 3 years ago with gradually worsening course since that time.The patient noted no past surgery on the left hip(s).  Patient currently rates pain in the left hip at 7 out of 10 with activity. Patient has night pain, worsening of pain with activity and weight bearing, pain that interfers with activities of daily living, pain with passive range of motion, crepitus and joint swelling. Patient has evidence of subchondral cysts, periarticular osteophytes and joint space narrowing by imaging studies. This condition presents safety issues increasing the risk of falls. There is no current active infection.  Patient Active Problem List   Diagnosis Date Noted  . Osteoarthritis of left hip 09/01/2013  . Encounter for preventive health examination 09/01/2013  . Hx of lumpectomy   . De Quervain's disease (radial styloid tenosynovitis) 04/02/2013  . Hot flushes, perimenopausal 02/21/2013  . Painful wrist 02/21/2013  . Hypertension   . Osteoarthritis    Past Medical History  Diagnosis Date  . Osteoarthritis     rheum  eval and mangement  . Heart murmur   . Hypertension     on meds for a few  years.   . MVP (mitral valve prolapse)     no murmur and no sx   . Hx of varicella   . Hx of lumpectomy 2013    [facility]   . H/O abnormal Pap smear     Past Surgical History  Procedure Laterality Date  . Breast lumpectomy  08/2012  . Dilation and curettage of uterus       Current outpatient prescriptions: escitalopram (LEXAPRO) 10 MG tablet,  Take 10 mg by mouth every morning., Disp: , Rfl: ;   lisinopril (PRINIVIL,ZESTRIL) 40 MG tablet, Take 40 mg by mouth every morning., Disp: , Rfl:   No Known Allergies  History  Substance Use Topics  . Smoking status: Former Games developermoker  . Smokeless tobacco: Never Used  . Alcohol Use: Yes     Comment: 1-2 glasses wine daily    Family History  Problem Relation Age of Onset  . Cancer Father     Prostate  . Mitral valve prolapse Sister      Review of Systems  Constitutional: Negative.   HENT: Negative.   Eyes: Negative.   Respiratory: Negative.   Cardiovascular: Negative.   Gastrointestinal: Negative.   Genitourinary: Negative.   Musculoskeletal: Positive for joint pain. Negative for back pain, falls, myalgias and neck pain.       Left hip pain  Skin: Negative.   Neurological: Negative.   Endo/Heme/Allergies: Negative.   Psychiatric/Behavioral: Negative.     Objective:  Physical Exam  Constitutional: She is oriented to person, place, and time. She appears well-developed and well-nourished. No distress.  HENT:  Head: Normocephalic and atraumatic.  Right Ear: External ear normal.  Left Ear: External ear normal.  Nose: Nose normal.  Mouth/Throat: Oropharynx is clear and moist.  Eyes: Conjunctivae and EOM are normal.  Neck: Normal range of motion. Neck supple.  Cardiovascular: Normal rate, regular rhythm and intact distal pulses.   Murmur heard.  Systolic murmur is present with a grade of 3/6  Respiratory: Effort normal and breath sounds normal. No respiratory distress. She has no wheezes.  GI: Soft. Bowel sounds are normal. She exhibits no distension. There is no tenderness.  Musculoskeletal:       Right hip: Normal.       Left hip: She exhibits decreased range of motion and crepitus.       Right knee: Normal.       Left knee: Normal.       Right lower leg: She exhibits no tenderness and no swelling.       Left lower leg: She exhibits no tenderness and no swelling.  Her  right hip shows normal range of motion with no discomfort. The left hip flexion is to about 100. No internal rotation, about 20-30 of external rotation and 20-30 of abduction. This is far less motion than she has on the right. Knee exam is normal.Gait pattern is minimally antalgic on the left.  Neurological: She is alert and oriented to person, place, and time. She has normal strength and normal reflexes. No sensory deficit.  Skin: No rash noted. She is not diaphoretic. No erythema.  Psychiatric: She has a normal mood and affect. Her behavior is normal.    Vitals Weight: 134 lb Height: 65.5 in Body Surface Area: 1.68 m Body Mass Index: 21.96 kg/m Pulse: 66 (Regular) BP: 112/74 (Sitting, Left Arm, Standard)  Imaging Review Plain radiographs demonstrate severe degenerative joint disease of the left hip(s). The bone quality appears to be good for age and reported activity level.  Assessment/Plan:  End stage arthritis, left hip(s)  The patient history, physical examination, clinical judgement of the provider and imaging studies are consistent with end stage degenerative joint disease of the left hip(s) and total hip arthroplasty is deemed medically necessary. The treatment options including medical management, injection therapy, arthroscopy and arthroplasty were discussed at length. The risks and benefits of total hip arthroplasty were presented and reviewed. The risks due to aseptic loosening, infection, stiffness, dislocation/subluxation,  thromboembolic complications and other imponderables were discussed.  The patient acknowledged the explanation, agreed to proceed with the plan and consent was signed. Patient is being admitted for inpatient treatment for surgery, pain control, PT, OT, prophylactic antibiotics, VTE prophylaxis, progressive ambulation and ADL's and discharge planning.The patient is planning to be discharged home with home health services    [place], [place]

## 2013-12-30 NOTE — Anesthesia Preprocedure Evaluation (Addendum)
Anesthesia Evaluation  Patient identified by MRN, date of birth, ID band Patient awake    Reviewed: Allergy & Precautions, H&P , NPO status , Patient's Chart, lab work & pertinent test results  Airway Mallampati: II      Dental  (+) Teeth Intact and Dental Advisory Given   Pulmonary neg pulmonary ROS, former smoker,  breath sounds clear to auscultation  Pulmonary exam normal       Cardiovascular hypertension, Pt. on medications Rhythm:Regular Rate:Normal     Neuro/Psych negative neurological ROS  negative psych ROS   GI/Hepatic negative GI ROS, Neg liver ROS,   Endo/Other  negative endocrine ROS  Renal/GU negative Renal ROS  negative genitourinary   Musculoskeletal  (+) Arthritis -, Osteoarthritis,    Abdominal   Peds  Hematology negative hematology ROS (+)   Anesthesia Other Findings   Reproductive/Obstetrics                         Anesthesia Physical Anesthesia Plan  ASA: I  Anesthesia Plan: Spinal   Post-op Pain Management:    Induction: Intravenous  Airway Management Planned: Simple Face Mask  Additional Equipment:   Intra-op Plan:   Post-operative Plan:   Informed Consent: I have reviewed the patients History and Physical, chart, labs and discussed the procedure including the risks, benefits and alternatives for the proposed anesthesia with the patient or authorized representative who has indicated his/her understanding and acceptance.   Dental advisory given  Plan Discussed with: CRNA  Anesthesia Plan Comments:        Anesthesia Quick Evaluation

## 2013-12-31 ENCOUNTER — Encounter (HOSPITAL_COMMUNITY)
Admission: RE | Disposition: A | Discharge status: Home Health | Payer: Self-pay | Source: Ambulatory Visit | Attending: Orthopedic Surgery

## 2013-12-31 ENCOUNTER — Encounter (HOSPITAL_COMMUNITY): Payer: Self-pay | Admitting: *Deleted

## 2013-12-31 ENCOUNTER — Inpatient Hospital Stay (HOSPITAL_COMMUNITY): Payer: BC Managed Care – PPO | Admitting: Anesthesiology

## 2013-12-31 ENCOUNTER — Encounter (HOSPITAL_COMMUNITY): Payer: BC Managed Care – PPO | Admitting: Anesthesiology

## 2013-12-31 ENCOUNTER — Inpatient Hospital Stay (HOSPITAL_COMMUNITY): Payer: BC Managed Care – PPO

## 2013-12-31 ENCOUNTER — Inpatient Hospital Stay (HOSPITAL_COMMUNITY)
Admission: RE | Admit: 2013-12-31 | Discharge: 2014-01-02 | DRG: 470 | Disposition: A | Discharge status: Home Health | Payer: BC Managed Care – PPO | Source: Ambulatory Visit | Attending: Orthopedic Surgery | Admitting: Orthopedic Surgery

## 2013-12-31 DIAGNOSIS — IMO0002 Reserved for concepts with insufficient information to code with codable children: Secondary | ICD-10-CM

## 2013-12-31 DIAGNOSIS — M1610 Unilateral primary osteoarthritis, unspecified hip: Principal | ICD-10-CM | POA: Diagnosis present

## 2013-12-31 DIAGNOSIS — M169 Osteoarthritis of hip, unspecified: Secondary | ICD-10-CM | POA: Diagnosis present

## 2013-12-31 DIAGNOSIS — I10 Essential (primary) hypertension: Secondary | ICD-10-CM | POA: Diagnosis present

## 2013-12-31 DIAGNOSIS — D62 Acute posthemorrhagic anemia: Secondary | ICD-10-CM | POA: Diagnosis not present

## 2013-12-31 DIAGNOSIS — Z96649 Presence of unspecified artificial hip joint: Secondary | ICD-10-CM

## 2013-12-31 DIAGNOSIS — Z87891 Personal history of nicotine dependence: Secondary | ICD-10-CM

## 2013-12-31 HISTORY — PX: TOTAL HIP ARTHROPLASTY: SHX124

## 2013-12-31 LAB — TYPE AND SCREEN
ABO/RH(D): A POS
Antibody Screen: NEGATIVE

## 2013-12-31 SURGERY — ARTHROPLASTY, HIP, TOTAL, ANTERIOR APPROACH
Anesthesia: Spinal | Site: Hip | Laterality: Left

## 2013-12-31 MED ORDER — FENTANYL CITRATE 0.05 MG/ML IJ SOLN
INTRAMUSCULAR | Status: DC | PRN
  Administered 2013-12-31: 25 ug via INTRAVENOUS
  Administered 2013-12-31: 50 ug via INTRAVENOUS

## 2013-12-31 MED ORDER — POLYETHYLENE GLYCOL 3350 17 G PO PACK: 17.0000 g | PACK | Freq: Every day | ORAL | Status: DC | PRN

## 2013-12-31 MED ORDER — PROPOFOL 10 MG/ML IV BOLUS
INTRAVENOUS | Status: AC
  Filled 2013-12-31: qty 20

## 2013-12-31 MED ORDER — 0.9 % SODIUM CHLORIDE (POUR BTL) OPTIME
TOPICAL | Status: DC | PRN
  Administered 2013-12-31: 1000 mL

## 2013-12-31 MED ORDER — RIVAROXABAN 10 MG PO TABS
10.0000 mg | ORAL_TABLET | Freq: Every day | ORAL | Status: DC
  Administered 2014-01-01 – 2014-01-02 (×2): 10 mg via ORAL
  Filled 2013-12-31 (×3): qty 1

## 2013-12-31 MED ORDER — KETOROLAC TROMETHAMINE 15 MG/ML IJ SOLN: 7.5000 mg | Freq: Four times a day (QID) | INTRAMUSCULAR | Status: AC | PRN

## 2013-12-31 MED ORDER — MENTHOL 3 MG MT LOZG
1.0000 | LOZENGE | OROMUCOSAL | Status: DC | PRN
Start: 2013-12-31 — End: 2014-01-02

## 2013-12-31 MED ORDER — ACETAMINOPHEN 325 MG PO TABS
650.0000 mg | ORAL_TABLET | Freq: Four times a day (QID) | ORAL | Status: DC | PRN
  Administered 2014-01-02: 650 mg via ORAL
  Filled 2013-12-31: qty 2

## 2013-12-31 MED ORDER — PHENOL 1.4 % MT LIQD: 1.0000 | OROMUCOSAL | Status: DC | PRN

## 2013-12-31 MED ORDER — STERILE WATER FOR IRRIGATION IR SOLN
Status: DC | PRN
  Administered 2013-12-31: 1500 mL

## 2013-12-31 MED ORDER — DOCUSATE SODIUM 100 MG PO CAPS
100.0000 mg | ORAL_CAPSULE | Freq: Two times a day (BID) | ORAL | Status: DC
  Administered 2014-01-01 – 2014-01-02 (×3): 100 mg via ORAL

## 2013-12-31 MED ORDER — MIDAZOLAM HCL 5 MG/5ML IJ SOLN
INTRAMUSCULAR | Status: DC | PRN
  Administered 2013-12-31: 2 mg via INTRAVENOUS

## 2013-12-31 MED ORDER — ACETAMINOPHEN 500 MG PO TABS
1000.0000 mg | ORAL_TABLET | Freq: Four times a day (QID) | ORAL | Status: AC
  Administered 2013-12-31 – 2014-01-01 (×4): 1000 mg via ORAL
  Filled 2013-12-31 (×4): qty 2

## 2013-12-31 MED ORDER — LACTATED RINGERS IV SOLN
INTRAVENOUS | Status: DC | PRN
  Administered 2013-12-31: 07:00:00 via INTRAVENOUS

## 2013-12-31 MED ORDER — CEFAZOLIN SODIUM-DEXTROSE 2-3 GM-% IV SOLR
2.0000 g | INTRAVENOUS | Status: AC
  Administered 2013-12-31: 2 g via INTRAVENOUS

## 2013-12-31 MED ORDER — FLEET ENEMA 7-19 GM/118ML RE ENEM: 1.0000 | ENEMA | Freq: Once | RECTAL | Status: AC | PRN

## 2013-12-31 MED ORDER — HYDROMORPHONE HCL PF 1 MG/ML IJ SOLN
0.2500 mg | INTRAMUSCULAR | Status: DC | PRN
  Administered 2013-12-31 (×2): 0.5 mg via INTRAVENOUS

## 2013-12-31 MED ORDER — LIDOCAINE HCL (CARDIAC) 20 MG/ML IV SOLN
INTRAVENOUS | Status: DC | PRN
  Administered 2013-12-31: 100 mg via INTRAVENOUS

## 2013-12-31 MED ORDER — SODIUM CHLORIDE 0.9 % IJ SOLN
INTRAMUSCULAR | Status: DC | PRN
  Administered 2013-12-31: 30 mL

## 2013-12-31 MED ORDER — LIDOCAINE HCL (CARDIAC) 20 MG/ML IV SOLN
INTRAVENOUS | Status: AC
  Filled 2013-12-31: qty 5

## 2013-12-31 MED ORDER — BUPIVACAINE HCL (PF) 0.25 % IJ SOLN
INTRAMUSCULAR | Status: AC
  Filled 2013-12-31: qty 30

## 2013-12-31 MED ORDER — SODIUM CHLORIDE 0.9 % IJ SOLN
INTRAMUSCULAR | Status: AC
  Filled 2013-12-31: qty 50

## 2013-12-31 MED ORDER — DEXAMETHASONE SODIUM PHOSPHATE 10 MG/ML IJ SOLN
10.0000 mg | Freq: Every day | INTRAMUSCULAR | Status: AC
  Filled 2013-12-31: qty 1

## 2013-12-31 MED ORDER — ONDANSETRON HCL 4 MG PO TABS: 4.0000 mg | ORAL_TABLET | Freq: Four times a day (QID) | ORAL | Status: DC | PRN

## 2013-12-31 MED ORDER — BISACODYL 10 MG RE SUPP: 10.0000 mg | Freq: Every day | RECTAL | Status: DC | PRN

## 2013-12-31 MED ORDER — LACTATED RINGERS IV SOLN: INTRAVENOUS | Status: DC

## 2013-12-31 MED ORDER — METHOCARBAMOL 100 MG/ML IJ SOLN
500.0000 mg | Freq: Four times a day (QID) | INTRAVENOUS | Status: DC | PRN
  Administered 2013-12-31: 500 mg via INTRAVENOUS
  Filled 2013-12-31: qty 5

## 2013-12-31 MED ORDER — ONDANSETRON HCL 4 MG/2ML IJ SOLN
INTRAMUSCULAR | Status: AC
  Filled 2013-12-31: qty 2

## 2013-12-31 MED ORDER — EPHEDRINE SULFATE 50 MG/ML IJ SOLN
INTRAMUSCULAR | Status: DC | PRN
  Administered 2013-12-31: 10 mg via INTRAVENOUS
  Administered 2013-12-31: 5 mg via INTRAVENOUS

## 2013-12-31 MED ORDER — PHENYLEPHRINE HCL 10 MG/ML IJ SOLN
INTRAMUSCULAR | Status: DC | PRN
  Administered 2013-12-31: 80 ug via INTRAVENOUS

## 2013-12-31 MED ORDER — FENTANYL CITRATE 0.05 MG/ML IJ SOLN
INTRAMUSCULAR | Status: AC
  Filled 2013-12-31: qty 2

## 2013-12-31 MED ORDER — SODIUM CHLORIDE 0.9 % IV SOLN
INTRAVENOUS | Status: DC
  Administered 2013-12-31: 75 mL/h via INTRAVENOUS
  Administered 2014-01-01: 01:00:00 via INTRAVENOUS

## 2013-12-31 MED ORDER — DIPHENHYDRAMINE HCL 12.5 MG/5ML PO ELIX: 12.5000 mg | ORAL_SOLUTION | ORAL | Status: DC | PRN

## 2013-12-31 MED ORDER — HYDROMORPHONE HCL PF 1 MG/ML IJ SOLN
INTRAMUSCULAR | Status: AC
  Filled 2013-12-31: qty 1

## 2013-12-31 MED ORDER — MIDAZOLAM HCL 2 MG/2ML IJ SOLN
INTRAMUSCULAR | Status: AC
  Filled 2013-12-31: qty 2

## 2013-12-31 MED ORDER — PHENYLEPHRINE 40 MCG/ML (10ML) SYRINGE FOR IV PUSH (FOR BLOOD PRESSURE SUPPORT)
PREFILLED_SYRINGE | INTRAVENOUS | Status: AC
  Filled 2013-12-31: qty 10

## 2013-12-31 MED ORDER — DEXAMETHASONE 4 MG PO TABS
10.0000 mg | ORAL_TABLET | Freq: Every day | ORAL | Status: AC
  Administered 2014-01-01: 10 mg via ORAL
  Filled 2013-12-31: qty 1

## 2013-12-31 MED ORDER — CEFAZOLIN SODIUM-DEXTROSE 2-3 GM-% IV SOLR
INTRAVENOUS | Status: AC
  Filled 2013-12-31: qty 50

## 2013-12-31 MED ORDER — BUPIVACAINE IN DEXTROSE 0.75-8.25 % IT SOLN
INTRATHECAL | Status: DC | PRN
  Administered 2013-12-31: 2 mL via INTRATHECAL

## 2013-12-31 MED ORDER — BUPIVACAINE LIPOSOME 1.3 % IJ SUSP
20.0000 mL | Freq: Once | INTRAMUSCULAR | Status: DC
  Filled 2013-12-31: qty 20

## 2013-12-31 MED ORDER — METOCLOPRAMIDE HCL 10 MG PO TABS: 5.0000 mg | ORAL_TABLET | Freq: Three times a day (TID) | ORAL | Status: DC | PRN

## 2013-12-31 MED ORDER — EPHEDRINE SULFATE 50 MG/ML IJ SOLN
INTRAMUSCULAR | Status: AC
  Filled 2013-12-31: qty 1

## 2013-12-31 MED ORDER — METOCLOPRAMIDE HCL 5 MG/ML IJ SOLN
5.0000 mg | Freq: Three times a day (TID) | INTRAMUSCULAR | Status: DC | PRN
  Administered 2013-12-31: 10 mg via INTRAVENOUS
  Filled 2013-12-31: qty 2

## 2013-12-31 MED ORDER — BUPIVACAINE HCL (PF) 0.25 % IJ SOLN
INTRAMUSCULAR | Status: DC | PRN
  Administered 2013-12-31: 20 mL

## 2013-12-31 MED ORDER — MORPHINE SULFATE 2 MG/ML IJ SOLN: 1.0000 mg | INTRAMUSCULAR | Status: DC | PRN

## 2013-12-31 MED ORDER — ONDANSETRON HCL 4 MG/2ML IJ SOLN: 4.0000 mg | Freq: Four times a day (QID) | INTRAMUSCULAR | Status: DC | PRN

## 2013-12-31 MED ORDER — DEXAMETHASONE SODIUM PHOSPHATE 10 MG/ML IJ SOLN: 10.0000 mg | Freq: Once | INTRAMUSCULAR | Status: DC

## 2013-12-31 MED ORDER — PHENYLEPHRINE HCL 10 MG/ML IJ SOLN
10.0000 mg | INTRAVENOUS | Status: DC | PRN
  Administered 2013-12-31: 25 ug/min via INTRAVENOUS

## 2013-12-31 MED ORDER — ACETAMINOPHEN 650 MG RE SUPP: 650.0000 mg | Freq: Four times a day (QID) | RECTAL | Status: DC | PRN

## 2013-12-31 MED ORDER — FENTANYL CITRATE 0.05 MG/ML IJ SOLN
INTRAMUSCULAR | Status: AC
  Filled 2013-12-31: qty 5

## 2013-12-31 MED ORDER — SODIUM CHLORIDE 0.9 % IV SOLN: INTRAVENOUS | Status: DC

## 2013-12-31 MED ORDER — BUPIVACAINE LIPOSOME 1.3 % IJ SUSP
INTRAMUSCULAR | Status: DC | PRN
  Administered 2013-12-31: 20 mL

## 2013-12-31 MED ORDER — PROMETHAZINE HCL 25 MG/ML IJ SOLN: 6.2500 mg | INTRAMUSCULAR | Status: DC | PRN

## 2013-12-31 MED ORDER — ONDANSETRON HCL 4 MG/2ML IJ SOLN
INTRAMUSCULAR | Status: DC | PRN
  Administered 2013-12-31: 4 mg via INTRAVENOUS

## 2013-12-31 MED ORDER — ATROPINE SULFATE 0.4 MG/ML IJ SOLN
INTRAMUSCULAR | Status: AC
  Filled 2013-12-31: qty 2

## 2013-12-31 MED ORDER — PROPOFOL INFUSION 10 MG/ML OPTIME
INTRAVENOUS | Status: DC | PRN
Start: 2013-12-31 — End: 2013-12-31
  Administered 2013-12-31: 50 ug/kg/min via INTRAVENOUS

## 2013-12-31 MED ORDER — METHOCARBAMOL 500 MG PO TABS
500.0000 mg | ORAL_TABLET | Freq: Four times a day (QID) | ORAL | Status: DC | PRN
  Administered 2013-12-31 – 2014-01-02 (×4): 500 mg via ORAL
  Filled 2013-12-31 (×4): qty 1

## 2013-12-31 MED ORDER — ACETAMINOPHEN 10 MG/ML IV SOLN
1000.0000 mg | Freq: Once | INTRAVENOUS | Status: AC
  Administered 2013-12-31: 1000 mg via INTRAVENOUS
  Filled 2013-12-31: qty 100

## 2013-12-31 MED ORDER — ESCITALOPRAM OXALATE 10 MG PO TABS
10.0000 mg | ORAL_TABLET | Freq: Every morning | ORAL | Status: DC
  Administered 2014-01-01 – 2014-01-02 (×2): 10 mg via ORAL
  Filled 2013-12-31 (×2): qty 1

## 2013-12-31 MED ORDER — VANCOMYCIN HCL IN DEXTROSE 1-5 GM/200ML-% IV SOLN
1000.0000 mg | Freq: Two times a day (BID) | INTRAVENOUS | Status: AC
  Administered 2013-12-31: 1000 mg via INTRAVENOUS
  Filled 2013-12-31: qty 200

## 2013-12-31 MED ORDER — SODIUM CHLORIDE 0.9 % IJ SOLN
INTRAMUSCULAR | Status: AC
  Filled 2013-12-31: qty 10

## 2013-12-31 MED ORDER — OXYCODONE HCL 5 MG PO TABS
5.0000 mg | ORAL_TABLET | ORAL | Status: DC | PRN
  Administered 2013-12-31 (×2): 5 mg via ORAL
  Administered 2014-01-01 (×2): 10 mg via ORAL
  Administered 2014-01-01 – 2014-01-02 (×4): 5 mg via ORAL
  Filled 2013-12-31: qty 1
  Filled 2013-12-31: qty 2
  Filled 2013-12-31: qty 1
  Filled 2013-12-31: qty 2
  Filled 2013-12-31 (×4): qty 1

## 2013-12-31 SURGICAL SUPPLY — 44 items
BAG ZIPLOCK 12X15 (MISCELLANEOUS) IMPLANT
BLADE EXTENDED COATED 6.5IN (ELECTRODE) ×3 IMPLANT
BLADE SAW SGTL 18X1.27X75 (BLADE) ×2 IMPLANT
BLADE SAW SGTL 18X1.27X75MM (BLADE) ×1
CAPT HIP PF COP ×3 IMPLANT
CLOSURE WOUND 1/2 X4 (GAUZE/BANDAGES/DRESSINGS) ×2
DECANTER SPIKE VIAL GLASS SM (MISCELLANEOUS) ×3 IMPLANT
DRAPE C-ARM 42X120 X-RAY (DRAPES) ×3 IMPLANT
DRAPE STERI IOBAN 125X83 (DRAPES) ×3 IMPLANT
DRAPE U-SHAPE 47X51 STRL (DRAPES) ×9 IMPLANT
DRSG ADAPTIC 3X8 NADH LF (GAUZE/BANDAGES/DRESSINGS) ×3 IMPLANT
DRSG MEPILEX BORDER 4X4 (GAUZE/BANDAGES/DRESSINGS) ×3 IMPLANT
DRSG MEPILEX BORDER 4X8 (GAUZE/BANDAGES/DRESSINGS) ×3 IMPLANT
DURAPREP 26ML APPLICATOR (WOUND CARE) ×3 IMPLANT
ELECT BLADE 6.5 EXT (BLADE) ×3 IMPLANT
ELECT REM PT RETURN 9FT ADLT (ELECTROSURGICAL) ×3
ELECTRODE REM PT RTRN 9FT ADLT (ELECTROSURGICAL) ×1 IMPLANT
EVACUATOR 1/8 PVC DRAIN (DRAIN) ×3 IMPLANT
FACESHIELD LNG OPTICON STERILE (SAFETY) ×12 IMPLANT
GLOVE BIO SURGEON STRL SZ7.5 (GLOVE) ×3 IMPLANT
GLOVE BIO SURGEON STRL SZ8 (GLOVE) ×6 IMPLANT
GLOVE BIOGEL PI IND STRL 8 (GLOVE) ×3 IMPLANT
GLOVE BIOGEL PI INDICATOR 8 (GLOVE) ×6
GOWN STRL REUS W/TWL LRG LVL3 (GOWN DISPOSABLE) ×3 IMPLANT
GOWN STRL REUS W/TWL XL LVL3 (GOWN DISPOSABLE) ×3 IMPLANT
KIT BASIN OR (CUSTOM PROCEDURE TRAY) ×3 IMPLANT
NDL SAFETY ECLIPSE 18X1.5 (NEEDLE) ×2 IMPLANT
NEEDLE HYPO 18GX1.5 SHARP (NEEDLE) ×4
PACK TOTAL JOINT (CUSTOM PROCEDURE TRAY) ×3 IMPLANT
PADDING CAST COTTON 6X4 STRL (CAST SUPPLIES) ×3 IMPLANT
PENCIL BUTTON HOLSTER BLD 10FT (ELECTRODE) ×3 IMPLANT
SPONGE GAUZE 4X4 12PLY (GAUZE/BANDAGES/DRESSINGS) IMPLANT
STRIP CLOSURE SKIN 1/2X4 (GAUZE/BANDAGES/DRESSINGS) ×4 IMPLANT
SUCTION FRAZIER 12FR DISP (SUCTIONS) IMPLANT
SUT ETHIBOND NAB CT1 #1 30IN (SUTURE) ×3 IMPLANT
SUT MNCRL AB 4-0 PS2 18 (SUTURE) ×3 IMPLANT
SUT VIC AB 2-0 CT1 27 (SUTURE) ×4
SUT VIC AB 2-0 CT1 TAPERPNT 27 (SUTURE) ×2 IMPLANT
SUT VLOC 180 0 24IN GS25 (SUTURE) ×3 IMPLANT
SYR 20CC LL (SYRINGE) ×3 IMPLANT
SYR 50ML LL SCALE MARK (SYRINGE) ×3 IMPLANT
TOWEL OR 17X26 10 PK STRL BLUE (TOWEL DISPOSABLE) ×3 IMPLANT
TRAY FOLEY CATH 14FRSI W/METER (CATHETERS) ×3 IMPLANT
YANKAUER SUCT BULB TIP NO VENT (SUCTIONS) ×3 IMPLANT

## 2013-12-31 NOTE — Transfer of Care (Signed)
Immediate Anesthesia Transfer of Care Note  Patient: Eileen Hall  Procedure(s) Performed: Procedure(s): LEFT TOTAL HIP ARTHROPLASTY ANTERIOR APPROACH  (Left)  Patient Location: PACU  Anesthesia Type:MAC and Regional  Level of Consciousness: awake, alert  and oriented  Airway & Oxygen Therapy: Patient Spontanous Breathing and Patient connected to face mask oxygen  Post-op Assessment: Report given to PACU RN and Post -op Vital signs reviewed and stable  Post vital signs: Reviewed and stable  Complications: No apparent anesthesia complications

## 2013-12-31 NOTE — Evaluation (Signed)
Physical Therapy Evaluation Patient Details Name: Eileen AspenGreta Hall MRN: [id number] DOB: 06/24/1956 Today's Date: 12/31/2013 Time: [id number] PT Time Calculation (min): 30 min  PT Assessment / Plan / Recommendation History of Present Illness     Clinical Impression  Pt s/p L THR presents with decreased L LE strength/ROM and post op pain and nausea limiting functional mobility.  Pt should progress well to d/c home with family assist and HHPT follow up.    PT Assessment  Patient needs continued PT services    Follow Up Recommendations  Home health PT    Does the patient have the potential to tolerate intense rehabilitation      Barriers to Discharge        Equipment Recommendations  Rolling walker with 5" wheels    Recommendations for Other Services OT consult   Frequency 7X/week    Precautions / Restrictions Precautions Precautions: Fall Restrictions Weight Bearing Restrictions: No Other Position/Activity Restrictions: WBAT   Pertinent Vitals/Pain 4/10; premed, ice pack provided      Mobility  Bed Mobility Overal bed mobility: Needs Assistance Bed Mobility: Supine to Sit;Sit to Supine Supine to sit: Min assist;Mod assist Sit to supine: Min assist;Mod assist General bed mobility comments: cues for sequence and use of R LE to self assist Transfers Overall transfer level: Needs assistance Equipment used: Rolling walker (2 wheeled) Transfers: Sit to/from Stand Sit to Stand: Min assist;Mod assist;+2 physical assistance General transfer comment: cues for LE management and use of UEs to self assist Ambulation/Gait Ambulation/Gait assistance: +2 safety/equipment;Min assist Ambulation Distance (Feet): 13 Feet Assistive device: Rolling walker (2 wheeled) Gait Pattern/deviations: Step-to pattern;Shuffle;Trunk flexed;Decreased step length - left;Decreased step length - right Gait velocity: decr General Gait Details: Cues for posture, sequence and position from RW - ltd by onset  nausea    Exercises Total Joint Exercises Ankle Circles/Pumps: AROM;10 reps;Supine;Both Quad Sets: AROM;Both;10 reps;Supine Heel Slides: AAROM;Left;15 reps;Supine Hip ABduction/ADduction: AAROM;Left;15 reps;Supine   PT Diagnosis: Difficulty walking  PT Problem List: Decreased strength;Decreased range of motion;Decreased activity tolerance;Decreased mobility;Decreased knowledge of use of DME;Pain PT Treatment Interventions: DME instruction;Gait training;Stair training;Functional mobility training;Therapeutic activities;Therapeutic exercise;Patient/family education     PT Goals(Current goals can be found in the care plan section) Acute Rehab PT Goals Patient Stated Goal: Resume previous lifestyle with decreased pain PT Goal Formulation: With patient Time For Goal Achievement: 01/07/14 Potential to Achieve Goals: Good  Visit Information  Last PT Received On: 12/31/13 Assistance Needed: +2 (2* nausea)       Prior Functioning  Home Living Family/patient expects to be discharged to:: Private residence Living Arrangements: Spouse/significant other Available Help at Discharge: Family Type of Home: House Home Access: Stairs to enter Secretary/administratorntrance Stairs-Number of Steps: 1 Home Layout: One level Home Equipment: None Prior Function Level of Independence: Independent Communication Communication: No difficulties Dominant Hand: Left    Cognition  Cognition Arousal/Alertness: Awake/alert Behavior During Therapy: WFL for tasks assessed/performed Overall Cognitive Status: Within Functional Limits for tasks assessed    Extremity/Trunk Assessment Upper Extremity Assessment Upper Extremity Assessment: Overall WFL for tasks assessed Lower Extremity Assessment Lower Extremity Assessment: LLE deficits/detail LLE Deficits / Details: Hip strength 2/5 with AAROM at hip to 90 flex and 15 abd   Balance    End of Session PT - End of Session Equipment Utilized During Treatment: Gait  belt Activity Tolerance: Other (comment) (ltd by nausea) Patient left: in bed;with call bell/phone within reach;with family/visitor present Nurse Communication: Mobility status  GP     Eileen Hall 12/31/2013,  4:38 PM

## 2013-12-31 NOTE — Op Note (Signed)
OPERATIVE REPORT  PREOPERATIVE DIAGNOSIS: Osteoarthritis of the Left hip.   POSTOPERATIVE DIAGNOSIS: Osteoarthritis of the Left  hip.   PROCEDURE: Left total hip arthroplasty, anterior approach.   SURGEON: Ollen Gross, MD   ASSISTANT: Avel Peace, PA-C  ANESTHESIA:  Spinal  ESTIMATED BLOOD LOSS:- 550 ml   DRAINS: Hemovac x1.   COMPLICATIONS: None   CONDITION: PACU - hemodynamically stable.   BRIEF CLINICAL NOTE: Eileen Hall is a 58 y.o. female who has advanced end-  stage arthritis of his Left  hip with progressively worsening pain and  dysfunction.The patient has failed nonoperative management and presents for  total hip arthroplasty.   PROCEDURE IN DETAIL: After successful administration of spinal  anesthetic, the traction boots for the [facility] bed were placed on both  feet and the patient was placed onto the [facility] bed, boots placed into the leg  holders. The Left hip was then isolated from the perineum with plastic  drapes and prepped and draped in the usual sterile fashion. ASIS and  greater trochanter were marked and a oblique incision was made, starting  at about 1 cm lateral and 2 cm distal to the ASIS and coursing towards  the anterior cortex of the femur. The skin was cut with a 10 blade  through subcutaneous tissue to the level of the fascia overlying the  tensor fascia lata muscle. The fascia was then incised in line with the  incision at the junction of the anterior third and posterior 2/3rd. The  muscle was teased off the fascia and then the interval between the TFL  and the rectus was developed. The Hohmann retractor was then placed at  the top of the femoral neck over the capsule. The vessels overlying the  capsule were cauterized and the fat on top of the capsule was removed.  A Hohmann retractor was then placed anterior underneath the rectus  femoris to give exposure to the entire anterior capsule. A T-shaped  capsulotomy was performed. The edges  were tagged and the femoral head  was identified.       Osteophytes are removed off the superior acetabulum.  The femoral neck was then cut in situ with an oscillating saw. Traction  was then applied to the left lower extremity utilizing the [facility]  traction. The femoral head was then removed. Retractors were placed  around the acetabulum and then circumferential removal of the labrum was  performed. Osteophytes were also removed. Reaming starts at 45 mm to  medialize and  Increased in 2 mm increments to 49 mm. We reamed in  approximately 40 degrees of abduction, 20 degrees anteversion. A 50 mm  pinnacle acetabular shell was then impacted in anatomic position under  fluoroscopic guidance with excellent purchase. We did not need to place  any additional dome screws. A 32 mm neutral + 4 marathon liner was then  placed into the acetabular shell.       The femoral lift was then placed along the lateral aspect of the femur  just distal to the vastus ridge. The leg was  externally rotated and capsule  was stripped off the inferior aspect of the femoral neck down to the  level of the lesser trochanter, this was done with electrocautery. The femur was lifted after this was performed. The  leg was then placed and extended in adducted position to essentially delivering the femur. We also removed the capsule superiorly and the  piriformis from the piriformis  fossa to gain excellent exposure of the  proximal femur. Rongeur was used to remove some cancellous bone to get  into the lateral portion of the proximal femur for placement of the  initial starter reamer. The starter broaches was placed  the starter broach  and was shown to go down the center of the canal. Broaching  with the  Corail system was then performed starting at size 8, coursing  Up to size 11. A size 11 had excellent torsional and rotational  and axial stability. The trial standard offset neck was then placed  with a 32 = 1 trial head.  The hip was then reduced. We confirmed that  the stem was in the canal both on AP and lateral x-rays. It also has excellent sizing. The hip was reduced with outstanding stability through full extension, full external rotation,  and then flexion in adduction internal rotation. AP pelvis was taken  and the leg lengths were measured and found to be exactly equal. Hip  was then dislocated again and the femoral head and neck removed. The  femoral broach was removed. Size 11 Corail stem with a standard offset  neck was then impacted into the femur following native anteversion. Has  excellent purchase in the canal. Excellent torsional and rotational and  axial stability. It is confirmed to be in the canal on AP and lateral  fluoroscopic views. The 32 + 1 ceramic head was placed and the hip  reduced with outstanding stability. Again AP pelvis was taken and it  confirmed that the leg lengths were equal. The wound was then copiously  irrigated with saline solution and the capsule reattached and repaired  with Ethibond suture.  20 mL of Exparel mixed with 50 mL of saline then additional 20 ml of .25% Bupivicaine injected into the capsule and into the edge of the tensor fascia lata as well as subcutaneous tissue. The fascia overlying the tensor fascia lata was  then closed with a running #1 V-Loc. Subcu was closed with interrupted  2-0 Vicryl and subcuticular running 4-0 Monocryl. Incision was cleaned  and dried. Steri-Strips and a bulky sterile dressing applied. Hemovac  drain was hooked to suction and then he was awakened and transported to  recovery in stable condition.        Please note that a surgical assistant was a medical necessity for this procedure to perform it in a safe and expeditious manner. Assistant was necessary to provide appropriate retraction of vital neurovascular structures and to prevent femoral fracture and allow for anatomic placement of the prosthesis.  Ollen GrossFrank Oviya Ammar, M.D.

## 2013-12-31 NOTE — Interval H&P Note (Signed)
History and Physical Interval Note:  12/31/2013 7:11 AM  [name]  has presented today for surgery, with the diagnosis of LEFT HIP OA   The various methods of treatment have been discussed with the patient and family. After consideration of risks, benefits and other options for treatment, the patient has consented to  Procedure(s): LEFT TOTAL HIP ARTHROPLASTY ANTERIOR APPROACH  (Left) as a surgical intervention .  The patient's history has been reviewed, patient examined, no change in status, stable for surgery.  I have reviewed the patient's chart and labs.  Questions were answered to the patient's satisfaction.     Loanne DrillingALUISIO,Takerra Lupinacci V

## 2013-12-31 NOTE — Preoperative (Signed)
Beta Blockers   Reason not to administer Beta Blockers:Not Applicable 

## 2013-12-31 NOTE — Anesthesia Procedure Notes (Signed)
Spinal  Patient location during procedure: OR Start time: 12/31/2013 7:25 AM End time: 12/31/2013 7:30 AM Staffing Anesthesiologist: Freddie Apley F Performed by: anesthesiologist  Preanesthetic Checklist Completed: patient identified, site marked, surgical consent, pre-op evaluation, timeout performed, IV checked, risks and benefits discussed and monitors and equipment checked Spinal Block Patient position: sitting Prep: Betadine Patient monitoring: heart rate, continuous pulse ox and blood pressure Approach: midline Location: L3-4 Injection technique: single-shot Needle Needle type: Spinocan  Needle gauge: 25 G Needle length: 9 cm Additional Notes Expiration date of kit checked and confirmed. Patient tolerated procedure well, without complications. Negative heme/paresthesia

## 2013-12-31 NOTE — Anesthesia Postprocedure Evaluation (Signed)
Anesthesia Post Note  Patient: Eileen Hall  Procedure(s) Performed: Procedure(s) (LRB): LEFT TOTAL HIP ARTHROPLASTY ANTERIOR APPROACH  (Left)  Anesthesia type: Spinal  Patient location: PACU  Post pain: Pain level controlled  Post assessment: Post-op Vital signs reviewed  Last Vitals:  Filed Vitals:   12/31/13 1246  BP: 126/85  Pulse: 72  Temp: 36.4 C  Resp: 13    Post vital signs: Reviewed  Level of consciousness: sedated  Complications: No apparent anesthesia complications

## 2014-01-01 DIAGNOSIS — D62 Acute posthemorrhagic anemia: Secondary | ICD-10-CM | POA: Diagnosis not present

## 2014-01-01 LAB — CBC
HEMATOCRIT: 25.7 % — AB (ref 36.0–46.0)
Hemoglobin: 8.5 g/dL — ABNORMAL LOW (ref 12.0–15.0)
MCH: 28.1 pg (ref 26.0–34.0)
MCHC: 33.1 g/dL (ref 30.0–36.0)
MCV: 84.8 fL (ref 78.0–100.0)
Platelets: 173 10*3/uL (ref 150–400)
RBC: 3.03 MIL/uL — ABNORMAL LOW (ref 3.87–5.11)
RDW: 13.3 % (ref 11.5–15.5)
WBC: 7.5 10*3/uL (ref 4.0–10.5)

## 2014-01-01 LAB — BASIC METABOLIC PANEL
BUN: 8 mg/dL (ref 6–23)
CHLORIDE: 102 meq/L (ref 96–112)
CO2: 27 meq/L (ref 19–32)
CREATININE: 0.74 mg/dL (ref 0.50–1.10)
Calcium: 8.2 mg/dL — ABNORMAL LOW (ref 8.4–10.5)
GFR calc Af Amer: >90 mL/min (ref >90)
GFR calc non Af Amer: >90 mL/min (ref >90)
Glucose, Bld: 110 mg/dL — ABNORMAL HIGH (ref 70–99)
Potassium: 4.2 mEq/L (ref 3.7–5.3)
Sodium: 138 mEq/L (ref 137–147)

## 2014-01-01 MED ORDER — SODIUM CHLORIDE 0.9 % IV BOLUS (SEPSIS)
250.0000 mL | Freq: Once | INTRAVENOUS | Status: AC
  Administered 2014-01-01: 250 mL via INTRAVENOUS

## 2014-01-01 MED ORDER — POLYSACCHARIDE IRON COMPLEX 150 MG PO CAPS
150.0000 mg | ORAL_CAPSULE | Freq: Every day | ORAL | Status: DC
  Administered 2014-01-01 – 2014-01-02 (×2): 150 mg via ORAL
  Filled 2014-01-01 (×2): qty 1

## 2014-01-01 MED ORDER — SODIUM CHLORIDE 0.9 % IV SOLN: INTRAVENOUS | Status: AC

## 2014-01-01 NOTE — Evaluation (Signed)
Occupational Therapy Evaluation Patient Details Name: Eileen AspenGreta Chance MRN: [id number] DOB: 02/14/1956 Today's Date: 01/01/2014 Time: [id number] OT Time Calculation (min): 27 min  OT Assessment / Plan / Recommendation History of present illness s/p THA   Clinical Impression   Pt presents to OT with decreased I with ADL activity s/p THA. Pt will benefit from skilled OT to increase I with ADL activity and return to PLOF    OT Assessment  Patient needs continued OT Services          Equipment Recommendations  None recommended by OT       Frequency  Min 2X/week    Precautions / Restrictions Precautions Precautions: Fall Restrictions Weight Bearing Restrictions: No Other Position/Activity Restrictions: WBAT       ADL  Grooming: Set up Where Assessed - Grooming: Unsupported sitting Upper Body Bathing: Set up Where Assessed - Upper Body Bathing: Unsupported sitting Lower Body Bathing: Moderate assistance Where Assessed - Lower Body Bathing: Unsupported sit to stand Upper Body Dressing: Set up Where Assessed - Upper Body Dressing: Unsupported sitting Lower Body Dressing: Moderate assistance Where Assessed - Lower Body Dressing: Unsupported sit to stand Toilet Transfer: Minimal assistance Toilet Transfer Method: Sit to stand;Stand pivot Where Assessed - Toileting Clothing Manipulation and Hygiene: Sit to stand from 3-in-1 or toilet Tub/Shower Transfer: Minimal assistance    OT Diagnosis: Generalized weakness  OT Problem List: Decreased strength;Decreased activity tolerance OT Treatment Interventions: Self-care/ADL training;Patient/family education   OT Goals(Current goals can be found in the care plan section) Acute Rehab OT Goals Patient Stated Goal: Resume previous lifestyle with decreased pain  Visit Information  Last OT Received On: 01/01/14 Assistance Needed: +1 History of Present Illness: s/p THA       Prior Functioning     Home Living Family/patient expects to  be discharged to:: Private residence Living Arrangements: Spouse/significant other Available Help at Discharge: Family Type of Home: House Home Access: Stairs to enter Secretary/administratorntrance Stairs-Number of Steps: 1 Home Layout: One level Home Equipment: None Prior Function Level of Independence: Independent Communication Communication: No difficulties Dominant Hand: Left         Vision/Perception Vision - History Patient Visual Report: No change from baseline   Cognition  Cognition Arousal/Alertness: Awake/alert Behavior During Therapy: WFL for tasks assessed/performed Overall Cognitive Status: Within Functional Limits for tasks assessed    Extremity/Trunk Assessment Upper Extremity Assessment Upper Extremity Assessment: Overall WFL for tasks assessed     Mobility Bed Mobility Overal bed mobility: Needs Assistance Bed Mobility: Supine to Sit Supine to sit: Min assist General bed mobility comments: cues for sequence and use of R LE to self assist Transfers Overall transfer level: Needs assistance Equipment used: Rolling walker (2 wheeled) Transfers: Sit to/from Stand Sit to Stand: Min assist General transfer comment: cues for LE management and use of UEs to self assist           End of Session OT - End of Session Activity Tolerance: Patient tolerated treatment well Patient left: in chair with call bell with in reach  GO     Gabrielle [place]ak[place], [place] KungLorraine D 01/01/2014, 2:18 PM

## 2014-01-01 NOTE — Progress Notes (Signed)
Utilization review completed.  

## 2014-01-01 NOTE — Care Management Note (Signed)
  Page 1 of 1   01/01/2014     6:20:10 PM   CARE MANAGEMENT NOTE 01/01/2014  Patient:  Eileen Hall,Eileen Hall   Account Number:  [id number]  Date Initiated:  01/01/2014  Documentation initiated by:  Colleen CanMANNING,Londyn Wotton  Subjective/Objective Assessment:   dx left hip replacemnt     Action/Plan:   CM spoke with patient. [facility] mother will be caregiver. She will need RW and 3n1.  [facility]   Anticipated DC Date:  01/02/2014   Anticipated DC Plan:  HOME W HOME HEALTH SERVICES      DC Planning Services  CM consult      [facility]   Choice offered to / List presented to:  C-1 Patient        HH arranged  HH-2 PT      [facility]   Status of service:  In process, will continue to follow Medicare Important Message given?   (If response is "NO", the following Medicare IM given date fields will be blank) Date Medicare IM given:   Date Additional Medicare IM given:    Discharge Disposition:    Per UR Regulation:  Reviewed for med. necessity/level of care/duration of stay  If discussed at Long Length of Stay Meetings, dates discussed:    Comments:

## 2014-01-01 NOTE — Discharge Instructions (Addendum)
°Dr. Frank Aluisio °Total Joint Specialist °[facility] °[address] °[place], [place] [address] °[phone] ° ° ° °ANTERIOR APPROACH TOTAL HIP REPLACEMENT POSTOPERATIVE DIRECTIONS ° ° °Hip Rehabilitation, Guidelines Following Surgery  °The results of a hip operation are greatly improved after range of motion and muscle strengthening exercises. Follow all safety measures which are given to protect your hip. If any of these exercises cause increased pain or swelling in your joint, decrease the amount until you are comfortable again. Then slowly increase the exercises. Call your caregiver if you have problems or questions.  °HOME CARE INSTRUCTIONS  °Most of the following instructions are designed to prevent the dislocation of your new hip.  °Remove items at home which could result in a fall. This includes throw rugs or furniture in walking pathways.  °Continue medications as instructed at time of discharge. °· You may have some home medications which will be placed on hold until you complete the course of blood thinner medication. °· You may start showering once you are discharged home but do not submerge the incision under water. Just pat the incision dry and apply a dry gauze dressing on daily. °Do not put on socks or shoes without following the instructions of your caregivers.  °Sit on high chairs which makes it easier to stand.  °Sit on chairs with arms. Use the chair arms to help push yourself up when arising.  °Keep your leg on the side of the operation out in front of you when standing up.  °Arrange for the use of a toilet seat elevator so you are not sitting low.   °· Walk with walker as instructed.  °You may resume a sexual relationship in one month or when given the OK by your caregiver.  °Use walker as long as suggested by your caregivers.  °You may put full weight on your legs and walk as much as is comfortable. °Avoid periods of inactivity such as sitting longer than an hour  when not asleep. This helps prevent blood clots.  °You may return to work once you are cleared by your surgeon.  °Do not drive a car for 6 weeks or until released by your surgeon.  °Do not drive while taking narcotics.  °Wear elastic stockings for three weeks following surgery during the day but you may remove then at night.  °Make sure you keep all of your appointments after your operation with all of your doctors and caregivers. You should call the office at the above phone number and make an appointment for approximately two weeks after the date of your surgery. °Change the dressing daily and reapply a dry dressing each time. °Please pick up a stool softener and laxative for home use as long as you are requiring pain medications. °· Continue to use ice on the hip for pain and swelling from surgery. You may notice swelling that will progress down to the foot and ankle.  This is normal after  surgery.  Elevate the leg when you are not up walking on it.   °It is important for you to complete the blood thinner medication as prescribed by your doctor. °· Continue to use the breathing machine which will help keep your temperature down.  It is common for your temperature to cycle up and down following surgery, especially at night when you are not up moving around and exerting yourself.  The breathing machine keeps your lungs expanded and your temperature down. ° °RANGE OF MOTION AND STRENGTHENING EXERCISES  °  These exercises are designed to help you keep full movement of your hip joint. Follow your caregiver's or physical therapist's instructions. Perform all exercises about fifteen times, three times per day or as directed. Exercise both hips, even if you have had only one joint replacement. These exercises can be done on a training (exercise) mat, on the floor, on a table or on a bed. Use whatever works the best and is most comfortable for you. Use music or television while you are exercising so that the exercises are  a pleasant break in your day. This will make your life better with the exercises acting as a break in routine you can look forward to.  Lying on your back, slowly slide your foot toward your buttocks, raising your knee up off the floor. Then slowly slide your foot back down until your leg is straight again.  Lying on your back spread your legs as far apart as you can without causing discomfort.  Lying on your side, raise your upper leg and foot straight up from the floor as far as is comfortable. Slowly lower the leg and repeat.  Lying on your back, tighten up the muscle in the front of your thigh (quadriceps muscles). You can do this by keeping your leg straight and trying to raise your heel off the floor. This helps strengthen the largest muscle supporting your knee.  Lying on your back, tighten up the muscles of your buttocks both with the legs straight and with the knee bent at a comfortable angle while keeping your heel on the floor.   SKILLED REHAB INSTRUCTIONS: If the patient is transferred to a skilled rehab facility following release from the hospital, a list of the current medications will be sent to the facility for the patient to continue.  When discharged from the skilled rehab facility, please have the facility set up the patient's Home Health Physical Therapy prior to being released. Also, the skilled facility will be responsible for providing the patient with their medications at time of release from the facility to include their pain medication, the muscle relaxants, and their blood thinner medication. If the patient is still at the rehab facility at time of the two week follow up appointment, the skilled rehab facility will also need to assist the patient in arranging follow up appointment in our office and any transportation needs.  MAKE SURE YOU:  Understand these instructions.  Will watch your condition.  Will get help right away if you are not doing well or get worse.  Pick up  stool softner and laxative for home. Do not submerge incision under water. May shower. Continue to use ice for pain and swelling from surgery. Total Hip Protocol.  Take Xarelto for two and a half more weeks, then discontinue Xarelto. Once the patient has completed the blood thinner regimen, then take a Baby 81 mg Aspirin daily for four more weeks.   Information on my medicine - XARELTO (Rivaroxaban)  This medication education was reviewed with me or my healthcare representative as part of my discharge preparation.  The pharmacist that spoke with me during my hospital stay was:  Otho BellowsGreen, Terri L, [facility]  Why was Xarelto prescribed for you? Xarelto was prescribed for you to reduce the risk of blood clots forming after orthopedic surgery. The medical term for these abnormal blood clots is venous thromboembolism (VTE).  What do you need to know about xarelto ? Take your Xarelto ONCE DAILY at the same time every day. You  may take it either with or without food.  If you have difficulty swallowing the tablet whole, you may crush it and mix in applesauce just prior to taking your dose.  Take Xarelto exactly as prescribed by your doctor and DO NOT stop taking Xarelto without talking to the doctor who prescribed the medication.  Stopping without other VTE prevention medication to take the place of Xarelto may increase your risk of developing a clot.  After discharge, you should have regular check-up appointments with your healthcare provider that is prescribing your Xarelto.    What do you do if you miss a dose? If you miss a dose, take it as soon as you remember on the same day then continue your regularly scheduled once daily regimen the next day. Do not take two doses of Xarelto on the same day.   Important Safety Information A possible side effect of Xarelto is bleeding. You should call your healthcare provider right away if you experience any of the following:   Bleeding from an  injury or your nose that does not stop.   Unusual colored urine (red or dark brown) or unusual colored stools (red or black).   Unusual bruising for unknown reasons.   A serious fall or if you hit your head (even if there is no bleeding).  Some medicines may interact with Xarelto and might increase your risk of bleeding while on Xarelto. To help avoid this, consult your healthcare provider or pharmacist prior to using any new prescription or non-prescription medications, including herbals, vitamins, non-steroidal anti-inflammatory drugs (NSAIDs) and supplements.  This website has more information on Xarelto: VisitDestination.com.br.

## 2014-01-01 NOTE — Progress Notes (Signed)
Physical Therapy Treatment Patient Details Name: Eileen Hall MRN: [id number] DOB: 11/02/1956 Today's Date: 01/01/2014 Time: 1030-1101 PT Time Calculation (min): 31 min  PT Assessment / Plan / Recommendation  History of Present Illness     PT Comments   Pt moving well but ltd by onset of nausea with OOB activity.  Follow Up Recommendations  Home health PT     Does the patient have the potential to tolerate intense rehabilitation     Barriers to Discharge        Equipment Recommendations  Rolling walker with 5" wheels    Recommendations for Other Services OT consult  Frequency 7X/week   Progress towards PT Goals Progress towards PT goals: Progressing toward goals  Plan Current plan remains appropriate    Precautions / Restrictions Precautions Precautions: Fall Restrictions Weight Bearing Restrictions: No Other Position/Activity Restrictions: WBAT   Pertinent Vitals/Pain 4/10: premed, ice pack provided    Mobility  Bed Mobility Overal bed mobility: Needs Assistance Bed Mobility: Supine to Sit Supine to sit: Min assist General bed mobility comments: cues for sequence and use of R LE to self assist Transfers Overall transfer level: Needs assistance Equipment used: Rolling walker (2 wheeled) Transfers: Sit to/from Stand Sit to Stand: Min assist General transfer comment: cues for LE management and use of UEs to self assist Ambulation/Gait Ambulation/Gait assistance: Min assist Ambulation Distance (Feet): 12 Feet Assistive device: Rolling walker (2 wheeled) Gait Pattern/deviations: Step-to pattern;Decreased step length - right;Decreased step length - left;Shuffle;Trunk flexed Gait velocity: decr General Gait Details: Cues for posture, sequence and position from RW - ltd by onset nausea    Exercises Total Joint Exercises Ankle Circles/Pumps: AROM;10 reps;Supine;Both Quad Sets: AROM;Both;10 reps;Supine Heel Slides: AAROM;Left;15 reps;Supine Hip ABduction/ADduction:  AAROM;Left;15 reps;Supine   PT Diagnosis:    PT Problem List:   PT Treatment Interventions:     PT Goals (current goals can now be found in the care plan section) Acute Rehab PT Goals Patient Stated Goal: Resume previous lifestyle with decreased pain PT Goal Formulation: With patient Time For Goal Achievement: 01/07/14 Potential to Achieve Goals: Good  Visit Information  Last PT Received On: 01/01/14 Assistance Needed: +2 (nausea with ambulation)    Subjective Data  Subjective: I think I'm moving ok but I keep getting nauseous when I get up Patient Stated Goal: Resume previous lifestyle with decreased pain   Cognition  Cognition Arousal/Alertness: Awake/alert Behavior During Therapy: WFL for tasks assessed/performed Overall Cognitive Status: Within Functional Limits for tasks assessed    Balance     End of Session PT - End of Session Equipment Utilized During Treatment: Gait belt Activity Tolerance: Other (comment) (nausea) Patient left: in chair;with call bell/phone within reach;with family/visitor present Nurse Communication: Mobility status   GP     Gera Inboden 01/01/2014, 12:34 PM

## 2014-01-01 NOTE — Progress Notes (Signed)
   Subjective: 1 Day Post-Op Procedure(s) (LRB): LEFT TOTAL HIP ARTHROPLASTY ANTERIOR APPROACH  (Left) Patient reports pain as mild in the anterior groin area. Patient seen in rounds with Dr. Lequita HaltAluisio. Patient is well, but has had some minor complaints of pain in the hip and groin, requiring pain medications We will start therapy today.  Plan is to go Home after hospital stay.  Objective: Vital signs in last 24 hours: Temp:  [97 F (36.1 C)-99.3 F (37.4 C)] 99.3 F (37.4 C) (01/29 0530) Pulse Rate:  [59-84] 84 (01/29 0530) Resp:  [4-18] 16 (01/29 0530) BP: (91-126)/(47-97) 103/67 mmHg (01/29 0530) SpO2:  [100 %] 100 % (01/29 0530) Weight:  [61.236 kg (135 lb)] 61.236 kg (135 lb) (01/28 1200)  Intake/Output from previous day:  Intake/Output Summary (Last 24 hours) at 01/01/14 0735 Last data filed at 01/01/14 0539  Gross per 24 hour  Intake 3751.25 ml  Output   4170 ml  Net -418.75 ml    Intake/Output this shift:    Labs:  Recent Labs  01/01/14 0525  HGB 8.5*    Recent Labs  01/01/14 0525  WBC 7.5  RBC 3.03*  HCT 25.7*  PLT 173    Recent Labs  01/01/14 0525  NA 138  K 4.2  CL 102  CO2 27  BUN 8  CREATININE 0.74  GLUCOSE 110*  CALCIUM 8.2*   No results found for this basename: LABPT, INR,  in the last 72 hours  EXAM General - Patient is Alert, Appropriate and Oriented Extremity - Neurovascular intact Sensation intact distally Dorsiflexion/Plantar flexion intact Dressing - dressing C/D/I Motor Function - intact, moving foot and toes well on exam.  Hemovac pulled without difficulty.  Past Medical History  Diagnosis Date  . Osteoarthritis     rheum  eval and mangement  . Heart murmur   . Hypertension     on meds for a few  years.   . MVP (mitral valve prolapse)     no murmur and no sx   . Hx of varicella   . Hx of lumpectomy 2013    [facility]   . H/O abnormal Pap smear     Assessment/Plan: 1 Day Post-Op Procedure(s)  (LRB): LEFT TOTAL HIP ARTHROPLASTY ANTERIOR APPROACH  (Left) Principal Problem:   OA (osteoarthritis) of hip Active Problems:   Postoperative anemia due to acute blood loss  Estimated body mass index is 22.12 kg/(m^2) as calculated from the following:   Height as of this encounter: 5' 5.5" (1.664 m).   Weight as of this encounter: 61.236 kg (135 lb). Advance diet Up with therapy Plan for discharge tomorrow Discharge home with home health  DVT Prophylaxis - Xarelto Weight Bearing As Tolerated left Leg Hemovac Pulled Begin Therapy HGB is lower today at 8.5.  Will recheck in AM and see how she does with therapy today.  Eileen Hall 01/01/2014, 7:35 AM

## 2014-01-01 NOTE — Progress Notes (Signed)
Physical Therapy Treatment Patient Details Name: Eileen Hall MRN: [id number] DOB: 09/02/1956 Today's Date: 01/01/2014 Time: [id number] PT Time Calculation (min): 32 min  PT Assessment / Plan / Recommendation  History of Present Illness s/p THA   PT Comments   Marked improvement in activity tolerance with no c/o nausea  Follow Up Recommendations  Home health PT     Does the patient have the potential to tolerate intense rehabilitation     Barriers to Discharge        Equipment Recommendations  Rolling walker with 5" wheels    Recommendations for Other Services OT consult  Frequency 7X/week   Progress towards PT Goals Progress towards PT goals: Progressing toward goals  Plan Current plan remains appropriate    Precautions / Restrictions Precautions Precautions: Fall Restrictions Weight Bearing Restrictions: No Other Position/Activity Restrictions: WBAT   Pertinent Vitals/Pain 3/10; Meds requested, ice pack provided    Mobility  Transfers Overall transfer level: Needs assistance Equipment used: Rolling walker (2 wheeled) Transfers: Sit to/from Stand Sit to Stand: Min assist;Min guard General transfer comment: cues for LE management and use of UEs to self assist Ambulation/Gait Ambulation/Gait assistance: Min assist;Min guard Ambulation Distance (Feet): 167 Feet (and 20) Assistive device: Rolling walker (2 wheeled) Gait Pattern/deviations: Step-to pattern;Step-through pattern;Shuffle;Decreased step length - right;Decreased step length - left Gait velocity: decr General Gait Details: Cues for posture, sequence and position from RW - ltd by onset nausea    Exercises     PT Diagnosis:    PT Problem List:   PT Treatment Interventions:     PT Goals (current goals can now be found in the care plan section) Acute Rehab PT Goals Patient Stated Goal: Resume previous lifestyle with decreased pain PT Goal Formulation: With patient Time For Goal Achievement:  01/07/14 Potential to Achieve Goals: Good  Visit Information  Last PT Received On: 01/01/14 Assistance Needed: +1 History of Present Illness: s/p THA    Subjective Data  Subjective: Much better Patient Stated Goal: Resume previous lifestyle with decreased pain   Cognition  Cognition Arousal/Alertness: Awake/alert Behavior During Therapy: WFL for tasks assessed/performed Overall Cognitive Status: Within Functional Limits for tasks assessed    Balance     End of Session PT - End of Session Equipment Utilized During Treatment: Gait belt Activity Tolerance: Patient tolerated treatment well Patient left: in chair;with call bell/phone within reach;with family/visitor present Nurse Communication: Mobility status   GP     [name] 01/01/2014, 4:25 PM

## 2014-01-02 LAB — BASIC METABOLIC PANEL
BUN: 7 mg/dL (ref 6–23)
CO2: 28 mEq/L (ref 19–32)
Calcium: 8.4 mg/dL (ref 8.4–10.5)
Chloride: 101 mEq/L (ref 96–112)
Creatinine, Ser: 0.69 mg/dL (ref 0.50–1.10)
GFR calc Af Amer: >90 mL/min (ref >90)
GLUCOSE: 112 mg/dL — AB (ref 70–99)
Potassium: 4.1 mEq/L (ref 3.7–5.3)
SODIUM: 138 meq/L (ref 137–147)

## 2014-01-02 LAB — CBC
HCT: 24.2 % — ABNORMAL LOW (ref 36.0–46.0)
HEMOGLOBIN: 8.2 g/dL — AB (ref 12.0–15.0)
MCH: 28.6 pg (ref 26.0–34.0)
MCHC: 33.9 g/dL (ref 30.0–36.0)
MCV: 84.3 fL (ref 78.0–100.0)
Platelets: 174 10*3/uL (ref 150–400)
RBC: 2.87 MIL/uL — AB (ref 3.87–5.11)
RDW: 13.3 % (ref 11.5–15.5)
WBC: 11.3 10*3/uL — ABNORMAL HIGH (ref 4.0–10.5)

## 2014-01-02 MED ORDER — POLYSACCHARIDE IRON COMPLEX 150 MG PO CAPS: 150.0000 mg | ORAL_CAPSULE | Freq: Two times a day (BID) | ORAL | Status: DC

## 2014-01-02 MED ORDER — OXYCODONE HCL 5 MG PO TABS: 5.0000 mg | ORAL_TABLET | ORAL | Status: DC | PRN

## 2014-01-02 MED ORDER — RIVAROXABAN 10 MG PO TABS
10.0000 mg | ORAL_TABLET | Freq: Every day | ORAL | Status: DC
Start: 2014-01-02 — End: 2014-04-07

## 2014-01-02 MED ORDER — METHOCARBAMOL 500 MG PO TABS: 500.0000 mg | ORAL_TABLET | Freq: Four times a day (QID) | ORAL | Status: DC | PRN

## 2014-01-02 NOTE — Progress Notes (Signed)
   Subjective: 2 Days Post-Op Procedure(s) (LRB): LEFT TOTAL HIP ARTHROPLASTY ANTERIOR APPROACH  (Left) Patient reports pain as mild.   Patient seen in rounds by Dr. Lequita HaltAluisio. Patient is well, and has had no acute complaints or problems Patient is ready to go home  Objective: Vital signs in last 24 hours: Temp:  [98.4 F (36.9 C)-99.2 F (37.3 C)] 98.6 F (37 C) (01/30 11910608) Pulse Rate:  [77-88] 81 (01/30 0608) Resp:  [16-18] 17 (01/30 0608) BP: (97-118)/(65-75) 105/67 mmHg (01/30 0608) SpO2:  [98 %-100 %] 99 % (01/30 47820608)  Intake/Output from previous day:  Intake/Output Summary (Last 24 hours) at 01/02/14 0816 Last data filed at 01/01/14 2223  Gross per 24 hour  Intake   1240 ml  Output   2515 ml  Net  -1275 ml    Intake/Output this shift:    Labs:  Recent Labs  01/01/14 0525 01/02/14 0455  HGB 8.5* 8.2*    Recent Labs  01/01/14 0525 01/02/14 0455  WBC 7.5 11.3*  RBC 3.03* 2.87*  HCT 25.7* 24.2*  PLT 173 174    Recent Labs  01/01/14 0525 01/02/14 0455  NA 138 138  K 4.2 4.1  CL 102 101  CO2 27 28  BUN 8 7  CREATININE 0.74 0.69  GLUCOSE 110* 112*  CALCIUM 8.2* 8.4   No results found for this basename: LABPT, INR,  in the last 72 hours  EXAM: General - Patient is Alert, Appropriate and Oriented Extremity - Neurovascular intact Sensation intact distally Dorsiflexion/Plantar flexion intact Incision - clean, dry, no drainage, healing Motor Function - intact, moving foot and toes well on exam.   Assessment/Plan: 2 Days Post-Op Procedure(s) (LRB): LEFT TOTAL HIP ARTHROPLASTY ANTERIOR APPROACH  (Left) Procedure(s) (LRB): LEFT TOTAL HIP ARTHROPLASTY ANTERIOR APPROACH  (Left) Past Medical History  Diagnosis Date  . Osteoarthritis     rheum  eval and mangement  . Heart murmur   . Hypertension     on meds for a few  years.   . MVP (mitral valve prolapse)     no murmur and no sx   . Hx of varicella   . Hx of lumpectomy 2013    [facility]   [facility]   . H/O abnormal Pap smear    Principal Problem:   OA (osteoarthritis) of hip Active Problems:   Postoperative anemia due to acute blood loss  Estimated body mass index is 22.12 kg/(m^2) as calculated from the following:   Height as of this encounter: 5' 5.5" (1.664 m).   Weight as of this encounter: 61.236 kg (135 lb). Up with therapy Discharge home with home health Diet - Cardiac diet Follow up - in 2 weeks Activity - WBAT Disposition - Home Condition Upon Discharge - Good D/C Meds - See DC Summary DVT Prophylaxis - Xarelto  PERKINS, ALEXZANDREW 01/02/2014, 8:16 AM

## 2014-01-02 NOTE — Progress Notes (Signed)
Occupational Therapy Treatment Patient Details Name: Philip AspenGreta Perri MRN: [id number] DOB: 05/09/1956 Today's Date: 01/02/2014 Time: [id number] OT Time Calculation (min): 23 min  OT Assessment / Plan / Recommendation  History of present illness s/p THA   OT comments  All education completed.  Min guard for safety, Hgb 8.2.    Follow Up Recommendations  No OT follow up    Barriers to Discharge       Equipment Recommendations  3 in 1 bedside comode    Recommendations for Other Services    Frequency     Progress towards OT Goals Progress towards OT goals: Progressing toward goals  Plan      Precautions / Restrictions Precautions Precautions: Fall Restrictions Weight Bearing Restrictions: No Other Position/Activity Restrictions: WBAT   Pertinent Vitals/Pain LLE painful with weightbearing and transitional movements 4/10.  Pt will reapply ice after that session.    ADL  Grooming: Set up Where Assessed - Grooming: Supported standing Toilet Transfer: Hydrographic surveyorMin guard Toilet Transfer Method: Sit to Baristastand Toilet Transfer Equipment: Raised toilet seat with arms (or 3-in-1 over toilet) Toileting - Clothing Manipulation and Hygiene: Supervision/safety Where Assessed - Engineer, miningToileting Clothing Manipulation and Hygiene: Sit to stand from 3-in-1 or toilet Tub/Shower Transfer: Min guard Tub/Shower Transfer Method: Science writerAmbulating Tub/Shower Transfer Equipment: Walk in shower Transfers/Ambulation Related to ADLs: ambulated to bathroom no cues needed ADL Comments: mother will help pt with adls.  She plans to get reacher and verbalizes understanding of uses.      OT Diagnosis:    OT Problem List:   OT Treatment Interventions:     OT Goals(current goals can now be found in the care plan section) ADL Goals Pt Will Transfer to Toilet: with supervision;bedside commode;ambulating Pt Will Perform Toileting - Clothing Manipulation and hygiene: with supervision;sit to/from stand Pt Will Perform Tub/Shower  Transfer: with supervision;ambulating;Shower transfer  Visit Information  Last OT Received On: 01/02/14 Assistance Needed: +1 History of Present Illness: s/p THA    Subjective Data      Prior Functioning       Cognition  Cognition Arousal/Alertness: Awake/alert Behavior During Therapy: WFL for tasks assessed/performed Overall Cognitive Status: Within Functional Limits for tasks assessed    Mobility  Bed Mobility Bed Mobility: Supine to Sit Supine to sit: Supervision General bed mobility comments: flat bed Transfers Transfers: Sit to/from Stand Sit to Stand: Supervision General transfer comment: cues for hand placement    Exercises      Balance    End of Session OT - End of Session Activity Tolerance: Patient tolerated treatment well Patient left:  (handed off to PT)  GO     Keontae Levingston 01/02/2014, 8:25 AM Marica OtterMaryellen Willona Phariss, OTR/L [phone] 01/02/2014

## 2014-01-02 NOTE — Plan of Care (Signed)
Problem: Consults Goal: Diagnosis- Total Joint Replacement Outcome: Completed/Met Date Met:  01/02/14 Primary Total Hip LEFT, Anterior

## 2014-01-02 NOTE — Progress Notes (Signed)
Physical Therapy Treatment Patient Details Name: Philip AspenGreta Crite MRN: [id number] DOB: 07/31/1956 Today's Date: 01/02/2014 Time: [id number] PT Time Calculation (min): 12 min  PT Assessment / Plan / Recommendation  History of Present Illness s/p THA   PT Comments   Pt up and moving with Sup and no c/o dizziness.  Follow Up Recommendations  Home health PT     Does the patient have the potential to tolerate intense rehabilitation     Barriers to Discharge        Equipment Recommendations  Rolling walker with 5" wheels    Recommendations for Other Services OT consult  Frequency 7X/week   Progress towards PT Goals Progress towards PT goals: Progressing toward goals  Plan Current plan remains appropriate    Precautions / Restrictions Precautions Precautions: Fall Restrictions Weight Bearing Restrictions: No Other Position/Activity Restrictions: WBAT   Pertinent Vitals/Pain 3/10;      Mobility  Bed Mobility Overal bed mobility: Needs Assistance Bed Mobility: Sit to Supine Sit to supine: Min assist General bed mobility comments: flat bed, min assist for management of L LE Transfers Overall transfer level: Needs assistance Equipment used: Rolling walker (2 wheeled) Transfers: Sit to/from Stand Sit to Stand: Supervision General transfer comment: cues for hand placement Ambulation/Gait Ambulation/Gait assistance: Min guard;Supervision Ambulation Distance (Feet): 100 Feet Assistive device: Rolling walker (2 wheeled) Gait Pattern/deviations: Step-to pattern;Step-through pattern;Shuffle Gait velocity: decr General Gait Details: min cues for posture and position from RW Stairs: Yes Stairs assistance: Min assist Stair Management: No rails;Forwards;With walker Number of Stairs: 1 (twice)    Exercises Total Joint Exercises Ankle Circles/Pumps: AROM;10 reps;Supine;Both Quad Sets: AROM;Both;10 reps;Supine Gluteal Sets: AROM;Both;10 reps;Supine Heel Slides: AAROM;Left;Supine;20  reps Hip ABduction/ADduction: AAROM;Left;Supine;20 reps   PT Diagnosis:    PT Problem List:   PT Treatment Interventions:     PT Goals (current goals can now be found in the care plan section) Acute Rehab PT Goals Patient Stated Goal: Resume previous lifestyle with decreased pain PT Goal Formulation: With patient Time For Goal Achievement: 01/07/14 Potential to Achieve Goals: Good  Visit Information  Last PT Received On: 01/02/14 Assistance Needed: +1 History of Present Illness: s/p THA    Subjective Data  Subjective: Mild dizziness with amb Patient Stated Goal: Resume previous lifestyle with decreased pain   Cognition  Cognition Arousal/Alertness: Awake/alert Behavior During Therapy: WFL for tasks assessed/performed Overall Cognitive Status: Within Functional Limits for tasks assessed    Balance     End of Session PT - End of Session Equipment Utilized During Treatment: Gait belt Activity Tolerance: Patient tolerated treatment well Patient left: Other (comment) (sitting at EOB) Nurse Communication: Mobility status   GP     Anusha Claus 01/02/2014, 1:51 PM

## 2014-01-02 NOTE — Progress Notes (Signed)
Physical Therapy Treatment Patient Details Name: Eileen AspenGreta Tetzlaff MRN: [id number] DOB: 03/05/1956 Today's Date: 01/02/2014 Time: [id number] PT Time Calculation (min): 32 min  PT Assessment / Plan / Recommendation  History of Present Illness s/p THA   PT Comments   Reviewed stairs and car transfers with pt and mother.  Pt progressing well - mild dizziness with ambulation - BP 103/67 - RN aware  Follow Up Recommendations  Home health PT     Does the patient have the potential to tolerate intense rehabilitation     Barriers to Discharge        Equipment Recommendations  Rolling walker with 5" wheels    Recommendations for Other Services OT consult  Frequency 7X/week   Progress towards PT Goals Progress towards PT goals: Progressing toward goals  Plan Current plan remains appropriate    Precautions / Restrictions Precautions Precautions: Fall Restrictions Weight Bearing Restrictions: No Other Position/Activity Restrictions: WBAT   Pertinent Vitals/Pain 2/10; premed    Mobility  Bed Mobility Overal bed mobility: Needs Assistance Bed Mobility: Sit to Supine Sit to supine: Min assist General bed mobility comments: flat bed, min assist for management of L LE Transfers Overall transfer level: Needs assistance Equipment used: Rolling walker (2 wheeled) Transfers: Sit to/from Stand Sit to Stand: Supervision General transfer comment: cues for hand placement Ambulation/Gait Ambulation/Gait assistance: Min guard;Supervision Ambulation Distance (Feet): 150 Feet Assistive device: Rolling walker (2 wheeled) Gait Pattern/deviations: Step-to pattern;Step-through pattern;Shuffle Gait velocity: decr General Gait Details: min cues for posture and position from RW Stairs: Yes Stairs assistance: Min assist Stair Management: No rails;Forwards;With walker Number of Stairs: 1 (twice)    Exercises Total Joint Exercises Ankle Circles/Pumps: AROM;10 reps;Supine;Both Quad Sets: AROM;Both;10  reps;Supine Gluteal Sets: AROM;Both;10 reps;Supine Heel Slides: AAROM;Left;Supine;20 reps Hip ABduction/ADduction: AAROM;Left;Supine;20 reps   PT Diagnosis:    PT Problem List:   PT Treatment Interventions:     PT Goals (current goals can now be found in the care plan section) Acute Rehab PT Goals Patient Stated Goal: Resume previous lifestyle with decreased pain PT Goal Formulation: With patient Time For Goal Achievement: 01/07/14 Potential to Achieve Goals: Good  Visit Information  Last PT Received On: 01/02/14 Assistance Needed: +1 History of Present Illness: s/p THA    Subjective Data  Subjective: Mild dizziness with amb Patient Stated Goal: Resume previous lifestyle with decreased pain   Cognition  Cognition Arousal/Alertness: Awake/alert Behavior During Therapy: WFL for tasks assessed/performed Overall Cognitive Status: Within Functional Limits for tasks assessed    Balance     End of Session PT - End of Session Equipment Utilized During Treatment: Gait belt Activity Tolerance: Patient tolerated treatment well;Other (comment) (lightheaded with amb) Patient left: in bed;with call bell/phone within reach;with family/visitor present Nurse Communication: Mobility status   GP     Graydon Fofana 01/02/2014, 1:41 PM

## 2014-01-12 ENCOUNTER — Telehealth: Payer: Self-pay | Admitting: Internal Medicine

## 2014-01-12 NOTE — Telephone Encounter (Signed)
Patient calling into the Triage line to discuss Blood pressure issues since surgery. Attempted to call patient back at [phone]. No answer. Left message on voicemail to please contact the office for assistance. No triage. No contact

## 2014-01-12 NOTE — Telephone Encounter (Signed)
Patient Information:  Caller Name: Edgardo RoysGreta  Phone: [phone]  Patient: Eileen Hall, Eileen Hall  Gender: Female  DOB: 10/22/1956  Age: 58 Years  PCP: Berniece AndreasPanosh, Wanda (Family Practice)  Pregnant: No  Office Follow Up:  Does the office need to follow up with this patient?: Yes  Instructions For The Office: Patient would like to change blood pressure medication dose . Please contact  RN Note:  Patient would like to go down to Lisinopril 20mg  .  Please have Dr. Fabian SharpPanosh review.  Symptoms  Reason For Call & Symptoms: Patient states she had hip replacement (left ) ten days ago. She is currently home and receiving PT at home. She has been tracking her blood pressure since arriving at home. She states her blood pressure is low.  She is currently talking Lisinopril 40mg  daily.  -   Ranges 102/58- , 114/68,  110/58, 115/62, 118/70. Denies weakness or dizziness or fatigue.  She is eating and drinking well.  The only new  medication- muscle relaxer- Robaxin,  Xarelto, Fe,  oxycodone prn  Reviewed Health History In EMR: Yes  Reviewed Medications In EMR: Yes  Reviewed Allergies In EMR: Yes  Reviewed Surgeries / Procedures: Yes  Date of Onset of Symptoms: 01/12/2014 OB / GYN:  LMP: Unknown  Guideline(s) Used:  High Blood Pressure  Disposition Per Guideline:   Home Care  Reason For Disposition Reached:   BP < 140 /90 and taking BP medications  Advice Given:  Call Back If:  You become worse.  RN Overrode Recommendation:  Document Patient  Patient would like to change blood pressure medication dose . Please contact

## 2014-01-13 NOTE — Telephone Encounter (Signed)
Ok to decrease to lisinopril 20 mg per day and follow BP readings   Can she break them in half or does she wish us to send in new RX? Ok for 90 days then plan fu to evaluate bp  3 months

## 2014-01-14 ENCOUNTER — Other Ambulatory Visit: Payer: Self-pay | Admitting: Family Medicine

## 2014-01-14 MED ORDER — LISINOPRIL 20 MG PO TABS
20.0000 mg | ORAL_TABLET | Freq: Every day | ORAL | Status: DC
Start: 2014-01-14 — End: 2014-03-20

## 2014-01-14 NOTE — Telephone Encounter (Signed)
Pt notified to take 20mg .  She opted for a new prescription.  Sent to the pharmacy for her for 90 days.  She made future follow up in May.

## 2014-01-26 NOTE — Discharge Summary (Signed)
Physician Discharge Summary   Patient ID: Eileen Hall MRN: [id number] DOB/AGE: 12-Aug-1956 58 y.o.  Admit date: 12/31/2013 Discharge date: 01/02/2014  Primary Diagnosis:  Osteoarthritis of the Left hip.   Admission Diagnoses:  Past Medical History  Diagnosis Date  . Osteoarthritis     rheum  eval and mangement  . Heart murmur   . Hypertension     on meds for a few  years.   . MVP (mitral valve prolapse)     no murmur and no sx   . Hx of varicella   . Hx of lumpectomy 2013    [facility]   . H/O abnormal Pap smear    Discharge Diagnoses:   Principal Problem:   OA (osteoarthritis) of hip Active Problems:   Postoperative anemia due to acute blood loss  Estimated body mass index is 22.12 kg/(m^2) as calculated from the following:   Height as of this encounter: 5' 5.5" (1.664 m).   Weight as of this encounter: 61.236 kg (135 lb).  Procedure(s) (LRB): LEFT TOTAL HIP ARTHROPLASTY ANTERIOR APPROACH  (Left)   Consults: None  HPI: Eileen Hall is a 57 y.o. female who has advanced end-  stage arthritis of his Left hip with progressively worsening pain and  dysfunction.The patient has failed nonoperative management and presents for  total hip arthroplasty.   Laboratory Data: Admission on 12/31/2013, Discharged on 01/02/2014  Component Date Value Ref Range Status  . WBC 01/01/2014 7.5  4.0 - 10.5 K/uL Final  . RBC 01/01/2014 3.03* 3.87 - 5.11 MIL/uL Final  . Hemoglobin 01/01/2014 8.5* 12.0 - 15.0 g/dL Final  . HCT 01/01/2014 25.7* 36.0 - 46.0 % Final  . MCV 01/01/2014 84.8  78.0 - 100.0 fL Final  . MCH 01/01/2014 28.1  26.0 - 34.0 pg Final  . MCHC 01/01/2014 33.1  30.0 - 36.0 g/dL Final  . RDW 01/01/2014 13.3  11.5 - 15.5 % Final  . Platelets 01/01/2014 173  150 - 400 K/uL Final  . Sodium 01/01/2014 138  137 - 147 mEq/L Final  . Potassium 01/01/2014 4.2  3.7 - 5.3 mEq/L Final  . Chloride 01/01/2014 102  96 - 112 mEq/L Final  . CO2 01/01/2014 27  19 - 32 mEq/L Final    . Glucose, Bld 01/01/2014 110* 70 - 99 mg/dL Final  . BUN 01/01/2014 8  6 - 23 mg/dL Final  . Creatinine, Ser 01/01/2014 0.74  0.50 - 1.10 mg/dL Final  . Calcium 01/01/2014 8.2* 8.4 - 10.5 mg/dL Final  . GFR calc non Af Amer 01/01/2014 >90  >90 mL/min Final  . GFR calc Af Amer 01/01/2014 >90  >90 mL/min Final   Comment: (NOTE)                          The eGFR has been calculated using the CKD EPI equation.                          This calculation has not been validated in all clinical situations.                          eGFR's persistently <90 mL/min signify possible Chronic Kidney                          Disease.  . WBC 01/02/2014 11.3* 4.0 -  10.5 K/uL Final  . RBC 01/02/2014 2.87* 3.87 - 5.11 MIL/uL Final  . Hemoglobin 01/02/2014 8.2* 12.0 - 15.0 g/dL Final  . HCT 01/02/2014 24.2* 36.0 - 46.0 % Final  . MCV 01/02/2014 84.3  78.0 - 100.0 fL Final  . MCH 01/02/2014 28.6  26.0 - 34.0 pg Final  . MCHC 01/02/2014 33.9  30.0 - 36.0 g/dL Final  . RDW 01/02/2014 13.3  11.5 - 15.5 % Final  . Platelets 01/02/2014 174  150 - 400 K/uL Final  . Sodium 01/02/2014 138  137 - 147 mEq/L Final  . Potassium 01/02/2014 4.1  3.7 - 5.3 mEq/L Final  . Chloride 01/02/2014 101  96 - 112 mEq/L Final  . CO2 01/02/2014 28  19 - 32 mEq/L Final  . Glucose, Bld 01/02/2014 112* 70 - 99 mg/dL Final  . BUN 01/02/2014 7  6 - 23 mg/dL Final  . Creatinine, Ser 01/02/2014 0.69  0.50 - 1.10 mg/dL Final  . Calcium 01/02/2014 8.4  8.4 - 10.5 mg/dL Final  . GFR calc non Af Amer 01/02/2014 >90  >90 mL/min Final  . GFR calc Af Amer 01/02/2014 >90  >90 mL/min Final   Comment: (NOTE)                          The eGFR has been calculated using the CKD EPI equation.                          This calculation has not been validated in all clinical situations.                          eGFR's persistently <90 mL/min signify possible Chronic Kidney                          Disease.  Hospital Outpatient Visit on 12/26/2013   Component Date Value Ref Range Status  . aPTT 12/26/2013 32  24 - 37 seconds Final  . WBC 12/26/2013 5.8  4.0 - 10.5 K/uL Final  . RBC 12/26/2013 4.86  3.87 - 5.11 MIL/uL Final  . Hemoglobin 12/26/2013 13.5  12.0 - 15.0 g/dL Final  . HCT 12/26/2013 41.5  36.0 - 46.0 % Final  . MCV 12/26/2013 85.4  78.0 - 100.0 fL Final  . MCH 12/26/2013 27.8  26.0 - 34.0 pg Final  . MCHC 12/26/2013 32.5  30.0 - 36.0 g/dL Final  . RDW 12/26/2013 13.6  11.5 - 15.5 % Final  . Platelets 12/26/2013 285  150 - 400 K/uL Final  . Sodium 12/26/2013 137  137 - 147 mEq/L Final  . Potassium 12/26/2013 4.4  3.7 - 5.3 mEq/L Final  . Chloride 12/26/2013 98  96 - 112 mEq/L Final  . CO2 12/26/2013 29  19 - 32 mEq/L Final  . Glucose, Bld 12/26/2013 92  70 - 99 mg/dL Final  . BUN 12/26/2013 20  6 - 23 mg/dL Final  . Creatinine, Ser 12/26/2013 0.82  0.50 - 1.10 mg/dL Final  . Calcium 12/26/2013 9.7  8.4 - 10.5 mg/dL Final  . Total Protein 12/26/2013 7.4  6.0 - 8.3 g/dL Final  . Albumin 12/26/2013 4.2  3.5 - 5.2 g/dL Final  . AST 12/26/2013 16  0 - 37 U/L Final  . ALT 12/26/2013 12  0 - 35 U/L Final  . Alkaline Phosphatase 12/26/2013 50  39 - 117 U/L Final  . Total Bilirubin 12/26/2013 0.3  0.3 - 1.2 mg/dL Final  . GFR calc non Af Amer 12/26/2013 79* >90 mL/min Final  . GFR calc Af Amer 12/26/2013 >90  >90 mL/min Final   Comment: (NOTE)                          The eGFR has been calculated using the CKD EPI equation.                          This calculation has not been validated in all clinical situations.                          eGFR's persistently <90 mL/min signify possible Chronic Kidney                          Disease.  Marland Kitchen Prothrombin Time 12/26/2013 12.0  11.6 - 15.2 seconds Final  . INR 12/26/2013 0.90  0.00 - 1.49 Final  . ABO/RH(D) 12/26/2013 A POS   Final  . Antibody Screen 12/26/2013 NEG   Final  . Sample Expiration 12/26/2013 01/03/2014   Final  . Color, Urine 12/26/2013 YELLOW  YELLOW Final  .  APPearance 12/26/2013 CLEAR  CLEAR Final  . Specific Gravity, Urine 12/26/2013 1.011  1.005 - 1.030 Final  . pH 12/26/2013 7.0  5.0 - 8.0 Final  . Glucose, UA 12/26/2013 NEGATIVE  NEGATIVE mg/dL Final  . Hgb urine dipstick 12/26/2013 NEGATIVE  NEGATIVE Final  . Bilirubin Urine 12/26/2013 NEGATIVE  NEGATIVE Final  . Ketones, ur 12/26/2013 NEGATIVE  NEGATIVE mg/dL Final  . Protein, ur 12/26/2013 NEGATIVE  NEGATIVE mg/dL Final  . Urobilinogen, UA 12/26/2013 0.2  0.0 - 1.0 mg/dL Final  . Nitrite 12/26/2013 NEGATIVE  NEGATIVE Final  . Leukocytes, UA 12/26/2013 NEGATIVE  NEGATIVE Final   MICROSCOPIC NOT DONE ON URINES WITH NEGATIVE PROTEIN, BLOOD, LEUKOCYTES, NITRITE, OR GLUCOSE <1000 mg/dL.  Marland Kitchen MRSA, PCR 12/26/2013 POSITIVE* NEGATIVE Final   Comment: RESULT CALLED TO, READ BACK BY AND VERIFIED WITH:                          M.FAISON AT 1318 ON [id number] BY C.BONGEL  . Staphylococcus aureus 12/26/2013 POSITIVE* NEGATIVE Final   Comment:                                 The Xpert SA Assay (FDA                          approved for NASAL specimens                          in patients over 83 years of age),                          is one component of                          a comprehensive surveillance  program.  Test performance has                          been validated by [facility] for patients greater                          than or equal to 35 year old.                          It is not intended                          to diagnose infection nor to                          guide or monitor treatment.  . ABO/RH(D) 12/26/2013 A POS   Final     X-Rays:Dg Pelvis Portable  12/31/2013   CLINICAL DATA:  Status post left total hip arthroplasty.  EXAM: PORTABLE PELVIS 1-2 VIEWS  COMPARISON:  Left hip radiograph 12/26/2013.  FINDINGS: Postoperative changes of left total hip arthroplasty are noted. The femoral and acetabular components of the  prosthesis appear to be well-seated, without definite periprosthetic fracture or other immediate complicating features. A surgical drain is present in the overlying soft tissues, as is a small amount of postoperative gas. Degenerative changes of moderate osteoarthritis are noted in the right hip joint.  IMPRESSION: 1. Expected postoperative appearance of the left hip following total hip arthroplasty, as above.   Electronically Signed   By: Vinnie Langton M.D.   On: 12/31/2013 09:50   Dg C-arm 1-60 Min-no Report  12/31/2013   CLINICAL DATA: left anterior hip   C-ARM 1-60 MINUTES  Fluoroscopy was utilized by the requesting physician.  No radiographic  interpretation.     EKG: Orders placed during the hospital encounter of 12/26/13  . EKG 12-LEAD  . EKG 12-LEAD  . EKG     Hospital Course: Patient was admitted to [facility] and taken to the OR and underwent the above state procedure without complications.  Patient tolerated the procedure well and was later transferred to the recovery room and then to the orthopaedic floor for postoperative care.  They were given PO and IV analgesics for pain control following their surgery.  They were given 24 hours of postoperative antibiotics of  Anti-infectives   Start     Dose/Rate Route Frequency Ordered Stop   12/31/13 1600  vancomycin (VANCOCIN) IVPB 1000 mg/200 mL premix     1,000 mg 200 mL/hr over 60 Minutes Intravenous Every 12 hours 12/31/13 1212 12/31/13 1755   12/31/13 0600  ceFAZolin (ANCEF) IVPB 2 g/50 mL premix     2 g 100 mL/hr over 30 Minutes Intravenous On call to O.R. 12/31/13 0034 12/31/13 0735     and started on DVT prophylaxis in the form of Xarelto.   PT and OT were ordered for total hip protocol.  The patient was allowed to be WBAT with therapy. Discharge planning was consulted to help with postop disposition and equipment needs.  Patient had a decent night on the evening of surgery.  They started to get up OOB with therapy on  day one. HGB  was lower today at 8.5. Rechecked in AM.  Hemovac drain was pulled without difficulty.  Continued to work with therapy into day two.  Dressing was changed on day two and the incision was healing well. Patient was seen in rounds and was ready to go home later on day two. HGB was 8.2  Discharge home with home health  Diet - Cardiac diet  Follow up - in 2 weeks  Activity - WBAT  Disposition - Home  Condition Upon Discharge - Good  D/C Meds - See DC Summary  DVT Prophylaxis - Xarelto       Discharge Orders   Future Appointments Provider Department Dept Phone   04/07/2014 8:30 AM Burnis Medin, MD [facility] [place]   Future Orders Complete By Expires   Call MD / Call 911  As directed    Comments:     If you experience chest pain or shortness of breath, CALL 911 and be transported to the hospital emergency room.  If you develope a fever above 101 F, pus (white drainage) or increased drainage or redness at the wound, or calf pain, call your surgeon's office.   Change dressing  As directed    Comments:     You may change your dressing dressing daily with sterile 4 x 4 inch gauze dressing and paper tape.  Do not submerge the incision under water.   Constipation Prevention  As directed    Comments:     Drink plenty of fluids.  Prune juice may be helpful.  You may use a stool softener, such as Colace (over the counter) 100 mg twice a day.  Use MiraLax (over the counter) for constipation as needed.   Diet - low sodium heart healthy  As directed    Discharge instructions  As directed    Comments:     Pick up stool softner and laxative for home. Do not submerge incision under water. May shower. Continue to use ice for pain and swelling from surgery.  Total Hip Protocol.  Take Xarelto for two and a half more weeks, then discontinue Xarelto. Once the patient has completed the blood thinner regimen, then take a Baby 81 mg Aspirin daily for four more  weeks.   Do not sit on low chairs, stoools or toilet seats, as it may be difficult to get up from low surfaces  As directed    Driving restrictions  As directed    Comments:     No driving until released by the physician.   Increase activity slowly as tolerated  As directed    Lifting restrictions  As directed    Comments:     No lifting until released by the physician.   Patient may shower  As directed    Comments:     You may shower without a dressing once there is no drainage.  Do not wash over the wound.  If drainage remains, do not shower until drainage stops.   TED hose  As directed    Comments:     Use stockings (TED hose) for 3 weeks on both leg(s).  You may remove them at night for sleeping.   Weight bearing as tolerated  As directed    Questions:     Laterality:     Extremity:         Medication List         escitalopram 10 MG tablet  Commonly known as:  LEXAPRO  Take 10  mg by mouth every morning.     iron polysaccharides 150 MG capsule  Commonly known as:  NIFEREX  Take 1 capsule (150 mg total) by mouth 2 (two) times daily.     methocarbamol 500 MG tablet  Commonly known as:  ROBAXIN  Take 1 tablet (500 mg total) by mouth every 6 (six) hours as needed for muscle spasms.     oxyCODONE 5 MG immediate release tablet  Commonly known as:  Oxy IR/ROXICODONE  Take 1-2 tablets (5-10 mg total) by mouth every 3 (three) hours as needed for moderate pain or severe pain.     rivaroxaban 10 MG Tabs tablet  Commonly known as:  XARELTO  - Take 1 tablet (10 mg total) by mouth daily with breakfast. Take Xarelto for two and a half more weeks, then discontinue Xarelto.  - Once the patient has completed the blood thinner regimen, then take a Baby 81 mg Aspirin daily for four more weeks.       Follow-up Information   Follow up with Gearlean Alf, MD. Schedule an appointment as soon as possible for a visit in 2 weeks.   Specialty:  Orthopedic Surgery   Contact information:     [address] [place] [address] [phone]       Signed: Mickel Crow 01/26/2014, 11:45 AM

## 2014-03-05 ENCOUNTER — Telehealth: Payer: Self-pay

## 2014-03-05 NOTE — Telephone Encounter (Signed)
Pt states that Dr Fabian SharpPanosh advised her that she would recommend her a eye doctor.  pt would like to be contacted with a name of an eye doctor

## 2014-03-09 NOTE — Telephone Encounter (Signed)
Dr Charlotte SanesMccuen But may others ok

## 2014-03-10 NOTE — Telephone Encounter (Signed)
Lmov  with Dr Charlotte SanesMccuen information for pt to contact office to set up an eye appt

## 2014-03-11 ENCOUNTER — Other Ambulatory Visit: Payer: Self-pay | Admitting: Internal Medicine

## 2014-03-16 ENCOUNTER — Other Ambulatory Visit: Payer: Self-pay | Admitting: Internal Medicine

## 2014-03-18 ENCOUNTER — Other Ambulatory Visit: Payer: Self-pay | Admitting: Internal Medicine

## 2014-03-20 ENCOUNTER — Other Ambulatory Visit: Payer: Self-pay | Admitting: Internal Medicine

## 2014-04-07 ENCOUNTER — Encounter: Payer: Self-pay | Admitting: Internal Medicine

## 2014-04-07 ENCOUNTER — Ambulatory Visit (INDEPENDENT_AMBULATORY_CARE_PROVIDER_SITE_OTHER): Payer: BC Managed Care – PPO | Admitting: Internal Medicine

## 2014-04-07 VITALS — BP 126/76 | Temp 98.3°F | Ht 66.0 in | Wt 135.0 lb

## 2014-04-07 DIAGNOSIS — I10 Essential (primary) hypertension: Secondary | ICD-10-CM

## 2014-04-07 DIAGNOSIS — F4322 Adjustment disorder with anxiety: Secondary | ICD-10-CM

## 2014-04-07 DIAGNOSIS — D649 Anemia, unspecified: Secondary | ICD-10-CM

## 2014-04-07 DIAGNOSIS — N951 Menopausal and female climacteric states: Secondary | ICD-10-CM

## 2014-04-07 LAB — POCT HEMOGLOBIN: Hemoglobin: 13.7 g/dL (ref 12.2–16.2)

## 2014-04-07 MED ORDER — ESCITALOPRAM OXALATE 20 MG PO TABS: 20.0000 mg | ORAL_TABLET | Freq: Every day | ORAL | Status: DC

## 2014-04-07 NOTE — Progress Notes (Signed)
Chief Complaint  Patient presents with  . Follow-up    HPI: Eileen Hall  comes in today for follow up of  multiple medical problems.  Since her last visit she did have her hip replacement in January did quite well and has finished her rehabilitation and has limited to 0 physical limitations but hasn't gotten back in DOB yet. HT  declreased medication to 20 mg  And doing well with good readings and no side effects obvious20 /76   Hot flushes on Lexapro 10 mg Returned at this time.  Not as bad at night .   Has had no blood work since her surgery but did take iron for while. No complications or bleeding ROS: See pertinent positives and negatives per HPI. Says her stomach bothers her mostly in the morning she thinks is from stress could goes away when she gets out of town. No other change in bowel habits no vomiting Poss of moving  Internationally. This has caused significant stress and anxiety. Does have people she could talk with no specific panic attack.  Past Medical History  Diagnosis Date  . Osteoarthritis     rheum  eval and mangement  . Heart murmur   . Hypertension     on meds for a few  years.   . MVP (mitral valve prolapse)     no murmur and no sx   . Hx of varicella   . Hx of lumpectomy 2013    [facility]   . H/O abnormal Pap smear     Family History  Problem Relation Age of Onset  . Cancer Father     Prostate  . Mitral valve prolapse Sister     History   Social History  . Marital Status: Married    Spouse Name: N/A    Number of Children: N/A  . Years of Education: N/A   Social History Main Topics  . Smoking status: Former Games developermoker  . Smokeless tobacco: Never Used  . Alcohol Use: Yes     Comment: 1-2 glasses wine daily  . Drug Use: No  . Sexual Activity: None   Other Topics Concern  . None   Social History Narrative   hh of 2 married neg ets  No pets    Husband Geni Bersick Rocco   caffeine 3 per day  1-2 per week .      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    International development    history of travel   Ex tobacco    Outpatient Encounter Prescriptions as of 04/07/2014  Medication Sig  . lisinopril (PRINIVIL,ZESTRIL) 20 MG tablet TAKE 1 TABLET (20 MG TOTAL) BY MOUTH DAILY.  . [DISCONTINUED] escitalopram (LEXAPRO) 10 MG tablet Take 10 mg by mouth every morning.  . escitalopram (LEXAPRO) 20 MG tablet Take 1 tablet (20 mg total) by mouth daily.  . [DISCONTINUED] escitalopram (LEXAPRO) 20 MG tablet Take 1 tablet (20 mg total) by mouth daily.  . [DISCONTINUED] iron polysaccharides (NIFEREX) 150 MG capsule Take 1 capsule (150 mg total) by mouth 2 (two) times daily.  . [DISCONTINUED] methocarbamol (ROBAXIN) 500 MG tablet Take 1 tablet (500 mg total) by mouth every 6 (six) hours as needed for muscle spasms.  . [DISCONTINUED] oxyCODONE (OXY IR/ROXICODONE) 5 MG immediate release tablet Take 1-2 tablets (5-10 mg total) by mouth every 3 (three) hours as needed for moderate pain or severe pain.  . [DISCONTINUED] rivaroxaban (XARELTO) 10 MG TABS tablet Take 1 tablet (10  mg total) by mouth daily with breakfast. Take Xarelto for two and a half more weeks, then discontinue Xarelto. Once the patient has completed the blood thinner regimen, then take a Baby 81 mg Aspirin daily for four more weeks.    EXAM:  BP 126/76  Temp(Src) 98.3 F (36.8 C) (Oral)  Ht 5\' 6"  (1.676 m)  Wt 135 lb (61.236 kg)  BMI 21.80 kg/m2  Body mass index is 21.8 kg/(m^2).  GENERAL: vitals reviewed and listed above, alert, oriented, appears well hydrated and in no acute distress HEENT: atraumatic, conjunctiva  clear, no obvious abnormalities on inspection of external nose and ears OP : no lesion edema or exudate  NECK: no obvious masses on inspection palpation  LUNGS: clear to auscultation bilaterally, no wheezes, rales or rhonchi, good air movement CV: HRRR, no clubbing cyanosis or  peripheral edema nl cap refill  MS: moves all extremities without noticeable focal   abnormality PSYCH: pleasant and cooperative, no obvious depression or anxiety Lab Results  Component Value Date   WBC 11.3* 01/02/2014   HGB 13.7 04/07/2014   HCT 24.2* 01/02/2014   PLT 174 01/02/2014   GLUCOSE 112* 01/02/2014   CHOL 236* 08/19/2013   TRIG 37.0 08/19/2013   HDL 116.70 08/19/2013   LDLDIRECT 103.8 08/19/2013   ALT 12 12/26/2013   AST 16 12/26/2013   NA 138 01/02/2014   K 4.1 01/02/2014   CL 101 01/02/2014   CREATININE 0.69 01/02/2014   BUN 7 01/02/2014   CO2 28 01/02/2014   TSH 0.93 08/19/2013   INR 0.90 12/26/2013    ASSESSMENT AND PLAN:  Discussed the following assessment and plan:  Anemia - Improved postop - Plan: POCT hemoglobin  Hypertension - Control continue medication  Hot flushes, perimenopausal - Some worsening but not is problematic tension to lifestyle exercise follow  Adjustment disorder with anxious mood - related to potential moving overseas  inc lexapro to 20 mg for now  -Patient advised to return or notify health care team  if symptoms worsen ,persist or new concerns arise.  Patient Instructions  Continue same bp medication.  Trial increase lexapro for the reactive stress anxiety  . Can also try ranitidine at night  And avoid excess caffiene and alcohol  advil, aleve products . Exercise  Can also help.  preventive visit  Due early October or thereabouts . However fu if needed before them    Eileen Hall M.D.  Pre visit review using our clinic review tool, if applicable. No additional management support is needed unless otherwise documented below in the visit note.

## 2014-04-07 NOTE — Patient Instructions (Signed)
Continue same bp medication.  Trial increase lexapro for the reactive stress anxiety  . Can also try ranitidine at night  And avoid excess caffiene and alcohol  advil, aleve products . Exercise  Can also help.  preventive visit  Due early October or thereabouts . However fu if needed before them

## 2014-04-08 ENCOUNTER — Telehealth: Payer: Self-pay | Admitting: Internal Medicine

## 2014-04-08 NOTE — Telephone Encounter (Signed)
Relevant patient education assigned to patient using Emmi. ° °

## 2014-04-10 ENCOUNTER — Telehealth: Payer: Self-pay | Admitting: Internal Medicine

## 2014-04-10 MED ORDER — ESCITALOPRAM OXALATE 5 MG PO TABS: 15.0000 mg | ORAL_TABLET | Freq: Every day | ORAL | Status: DC

## 2014-04-10 NOTE — Telephone Encounter (Signed)
Pt states escitalopram (LEXAPRO) 20 MG tablet  Gives her headaches and nausea. She would like another [facility] [address]. She is requesting 5 mg tablets and said she will take 15 mg and see how that makes her feel.

## 2014-04-10 NOTE — Telephone Encounter (Signed)
[facility]  Received [facility] to send in 5 mg tabs.

## 2014-04-10 NOTE — Telephone Encounter (Signed)
Left a message on home/cell for the pt to return my call. 

## 2014-04-10 NOTE — Telephone Encounter (Signed)
Or we can do 10 mg and have her take 1.5? Disp [id number]    Ok to do this either way

## 2014-05-04 ENCOUNTER — Other Ambulatory Visit: Payer: Self-pay | Admitting: Internal Medicine

## 2014-05-05 NOTE — Telephone Encounter (Signed)
Sent to the pharmacy by e-scribe. 

## 2014-05-23 ENCOUNTER — Other Ambulatory Visit: Payer: Self-pay | Admitting: Internal Medicine

## 2014-05-25 NOTE — Telephone Encounter (Signed)
Sent to the pharmacy by e-scribe. 

## 2014-06-15 ENCOUNTER — Other Ambulatory Visit: Payer: Self-pay | Admitting: Internal Medicine

## 2014-06-16 NOTE — Telephone Encounter (Signed)
Filled on 05/25/14 #90.  Refill too early.

## 2014-06-22 ENCOUNTER — Telehealth: Payer: Self-pay | Admitting: Internal Medicine

## 2014-06-22 NOTE — Telephone Encounter (Signed)
[facility] is requesting re-fill on lisinopril (PRINIVIL,ZESTRIL) 20 MG tablet  [address] [facility] [address] [place] [id number] [phone]

## 2014-06-25 MED ORDER — LISINOPRIL 20 MG PO TABS: ORAL_TABLET | ORAL | Status: AC

## 2014-06-25 NOTE — Telephone Encounter (Signed)
[facility] to the [facility].

## 2014-09-01 ENCOUNTER — Other Ambulatory Visit: Payer: BC Managed Care – PPO

## 2014-09-08 ENCOUNTER — Encounter: Payer: BC Managed Care – PPO | Admitting: Internal Medicine

## 2015-04-05 IMAGING — CR DG HIP (WITH OR WITHOUT PELVIS) 2-3V*L*
3 series · 3 of 3 positions shown · non-contrast
Comparison: None.

CLINICAL DATA: Preop for left hip replacement. Chronic left hip
pain.

EXAM:
LEFT HIP - COMPLETE 2+ VIEW

[t pelvis a.p.]
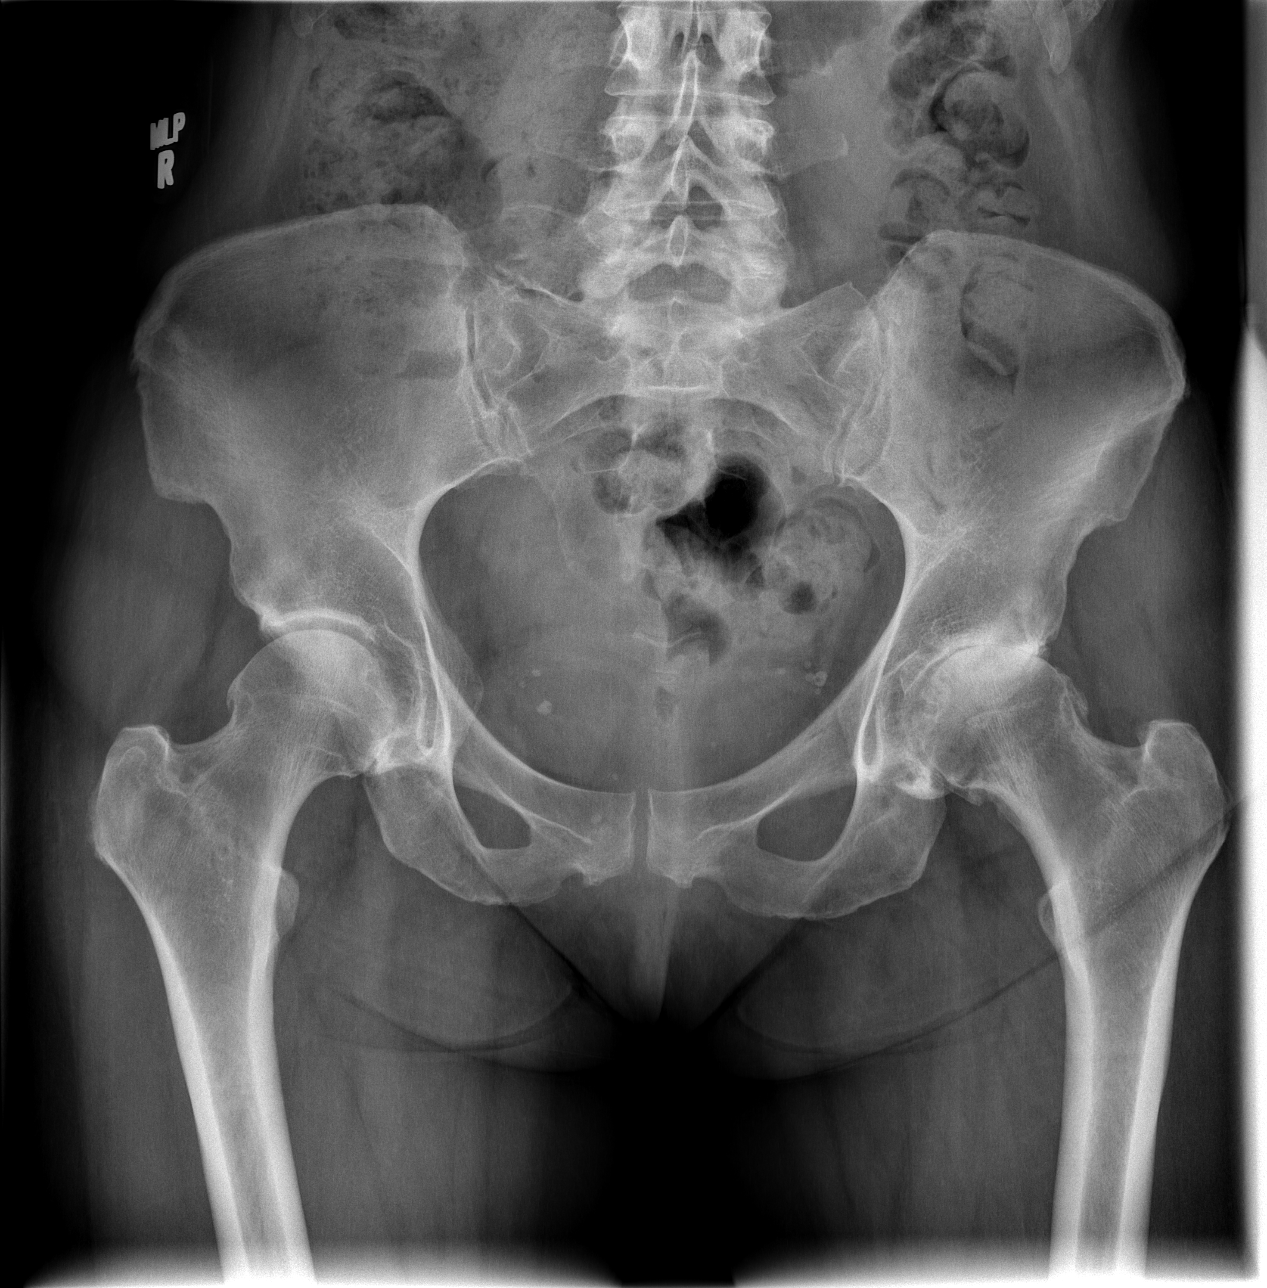

[t hip ap left]
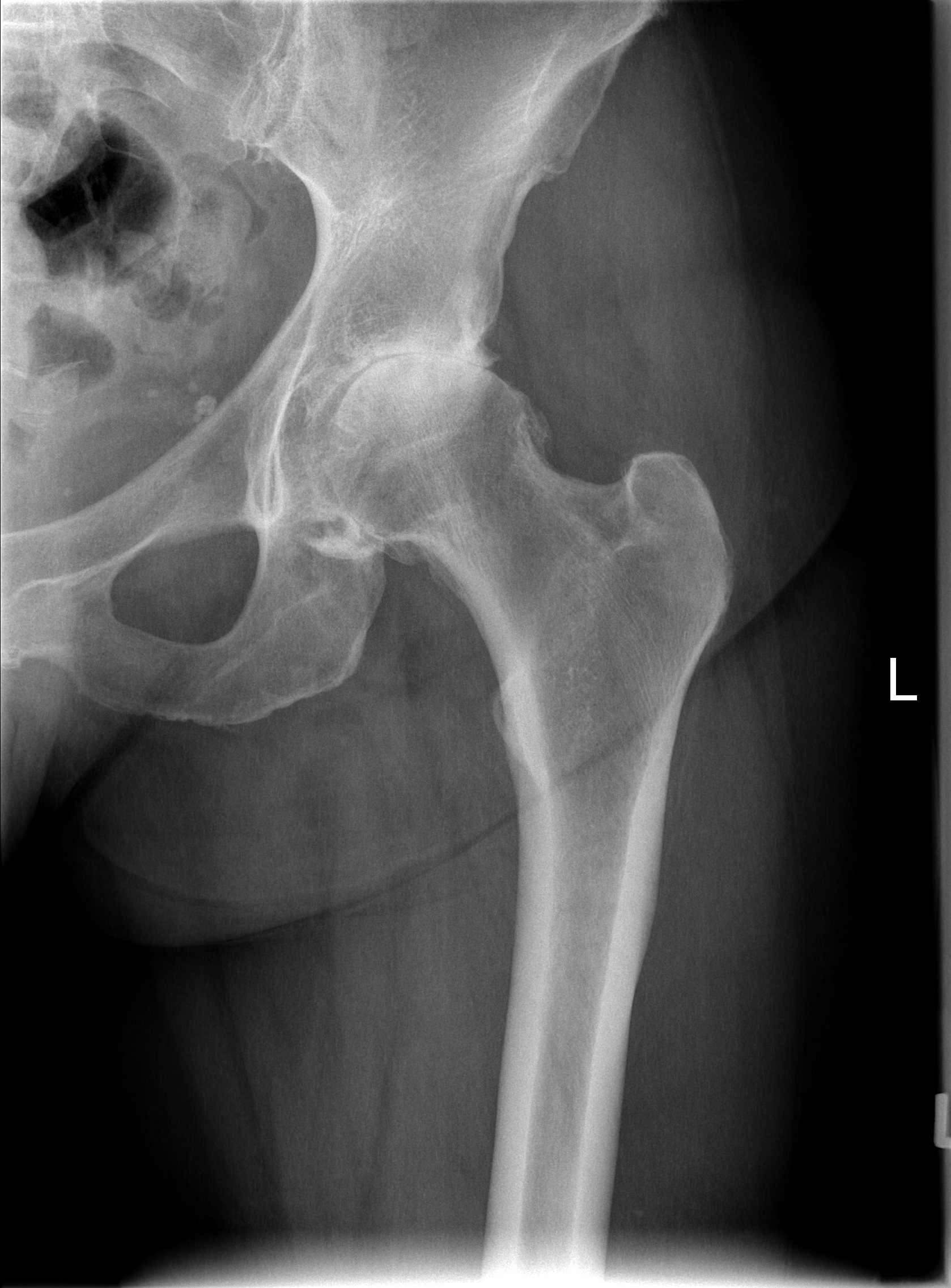

[t hip frog leg left]
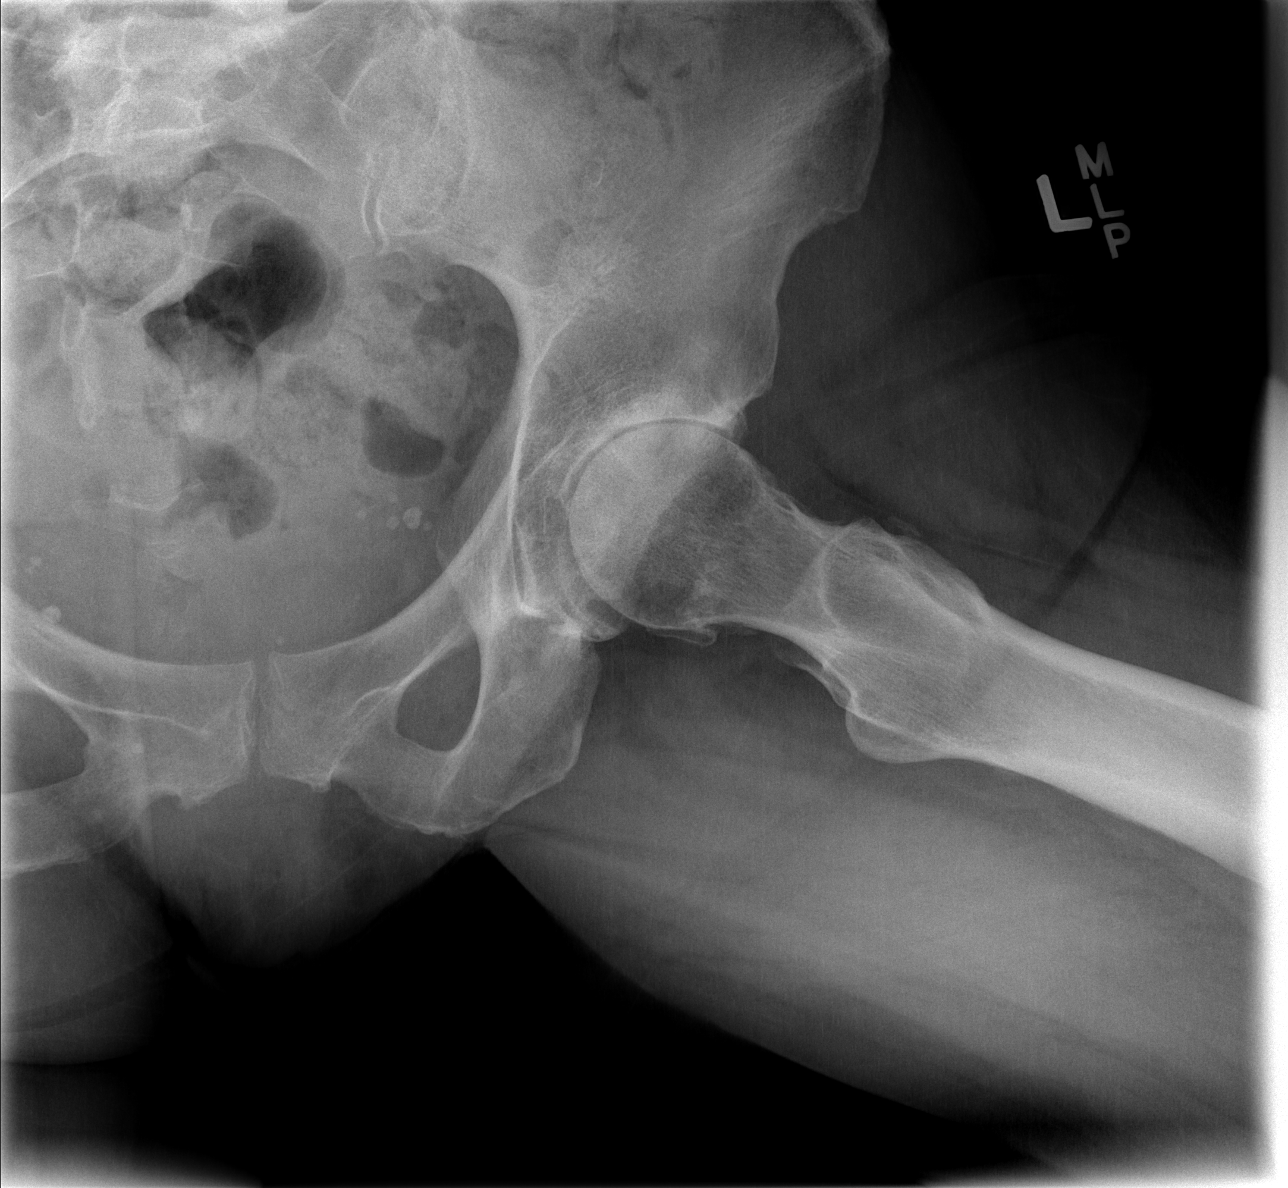

[3 of 3 positions shown; findings below may reference images not displayed]

FINDINGS: There is severe superior lateral joint space narrowing with
subchondral sclerosis and cystic change. Marginal osteophytes extend
along the base of the left femoral head. No fracture

The right hip joint is normally space and aligned. There are small
osteophytes along the base of the right femoral head.

There is a partial transitional lumbosacral vertebrae. The bony
pelvis is intact

The soft tissues are unremarkable.
IMPRESSION: Advanced arthropathic changes of the left hip. No fracture or
dislocation.

## 2015-05-17 ENCOUNTER — Telehealth: Payer: Self-pay

## 2015-05-17 NOTE — Telephone Encounter (Signed)
Pt said she moved to [place].

## 2015-05-17 NOTE — Telephone Encounter (Signed)
Left detailed message to call back concerning overdue mammogram.
